# Patient Record
Sex: Male | Born: 1985 | Race: White | Hispanic: No | Marital: Married | Smoking: Never smoker
Health system: Southern US, Community
[De-identification: ages and names within clinical notes are randomized; demographics above are authoritative.]

## PROBLEM LIST (undated history)

## (undated) DIAGNOSIS — J45909 Unspecified asthma, uncomplicated: Secondary | ICD-10-CM

## (undated) DIAGNOSIS — T7840XA Allergy, unspecified, initial encounter: Secondary | ICD-10-CM

## (undated) DIAGNOSIS — Z800 Family history of malignant neoplasm of digestive organs: Secondary | ICD-10-CM

## (undated) DIAGNOSIS — G4730 Sleep apnea, unspecified: Secondary | ICD-10-CM

## (undated) HISTORY — PX: WISDOM TOOTH EXTRACTION: SHX21

## (undated) HISTORY — DX: Allergy, unspecified, initial encounter: T78.40XA

## (undated) HISTORY — DX: Sleep apnea, unspecified: G47.30

## (undated) HISTORY — DX: Family history of malignant neoplasm of digestive organs: Z80.0

## (undated) HISTORY — PX: OTHER SURGICAL HISTORY: SHX169

## (undated) HISTORY — DX: Unspecified asthma, uncomplicated: J45.909

## (undated) HISTORY — PX: FRACTURE SURGERY: SHX138

---

## 2016-02-08 ENCOUNTER — Ambulatory Visit (INDEPENDENT_AMBULATORY_CARE_PROVIDER_SITE_OTHER): Payer: 59 | Admitting: Allergy and Immunology

## 2016-02-08 ENCOUNTER — Encounter: Payer: Self-pay | Admitting: Allergy and Immunology

## 2016-02-08 VITALS — BP 128/74 | HR 78 | Temp 97.8°F | Resp 18 | Ht 76.0 in | Wt 295.4 lb

## 2016-02-08 DIAGNOSIS — T783XXA Angioneurotic edema, initial encounter: Secondary | ICD-10-CM

## 2016-02-08 DIAGNOSIS — Z8709 Personal history of other diseases of the respiratory system: Secondary | ICD-10-CM | POA: Diagnosis not present

## 2016-02-08 DIAGNOSIS — T7840XA Allergy, unspecified, initial encounter: Secondary | ICD-10-CM

## 2016-02-08 MED ORDER — EPINEPHRINE 0.3 MG/0.3ML IJ SOAJ: 0.3000 mg | Freq: Once | INTRAMUSCULAR | Status: AC

## 2016-02-08 NOTE — Patient Instructions (Addendum)
Allergic reaction The patient's history suggests alcohol intolerance.  Typically, this is related to rosacea, sulfite sensitivity, or genetic variations of enzyme alcohol dehydrogenase, though the latter is usually found in the Asian population. Given the associated angioedema of the lips, tongue, and eyelids, an epinephrine auto-injector will be provided.   Avoidance of alcohol containing products is recommended.  Should significant symptoms or new symptoms occur, a journal is to be kept recording any foods eaten, beverages consumed, medications taken, activities performed, and environmental conditions within a 6 hour time period prior to the onset of symptoms. For any symptoms concerning for anaphylaxis, epinephrine is to be administered and 911 is to be called immediately.   A prescription has been provided for EpiPen 0.3 mg 2 pack along with instructions for its proper administration.  History of asthma Earl Ortiz has not had significant lower respiratory symptoms or bronchodilator rescue medication requirement over the past 5 years despite upper respiratory tract infections and vigorous exercise.  Spirometry today reveals normal ventilatory function. As such, no treatment or further evaluation is necessary unless lower respiratory symptoms arise.    Return if symptoms worsen or fail to improve.

## 2016-02-08 NOTE — Progress Notes (Signed)
New Patient Note  RE: Earl Ortiz MRN: [id number] DOB: Dec 17, 1985 Date of Office Visit: 02/08/2016  Referring provider: No ref. provider found Primary care provider: Helane Rima, MD  Chief Complaint: Allergic Reaction   History of present illness: HPI Comments: Earl Ortiz is a 30 y.o. male for evaluation of possible food allergy.  He reports that over the past several years he has developed symptoms within minutes of consuming alcohol.  He experiences flushing of the face, neck, and arms, as well as occasional tingling at the tip of his tongue, mild swelling of the eyelids, and mild swelling of the lips.  Over the past several years the symptoms have progressed in severity.  He denies cardiopulmonary or other GI symptoms.  He has experienced one or more of these symptoms with various types of alcohol.  He states that he rarely consumes alcohol and therefore it is clear that these symptoms only occur when he is consuming alcohol.  The only time that he has experienced similar symptoms in the absence of an alcoholic beverage was when he took NyQuil, containing a small amount of alcohol, for upper respiratory tract infection symptom relief.  He has no Asian limits that he is aware of.  He reports that over this past year he has been diagnosed with gout and also was found to have abnormal liver function tests despite rarely consuming alcohol.   Assessment and plan: Allergic reaction The patient's history suggests alcohol intolerance.  Typically, this is related to rosacea, sulfite sensitivity, or genetic variations of enzyme alcohol dehydrogenase, though the latter is usually found in the Asian population. Given the associated angioedema of the lips, tongue, and eyelids, an epinephrine auto-injector will be provided.   Avoidance of alcohol containing products is recommended.  Should significant symptoms or new symptoms occur, a journal is to be kept recording any foods eaten, beverages  consumed, medications taken, activities performed, and environmental conditions within a 6 hour time period prior to the onset of symptoms. For any symptoms concerning for anaphylaxis, epinephrine is to be administered and 911 is to be called immediately.   A prescription has been provided for EpiPen 0.3 mg 2 pack along with instructions for its proper administration.  History of asthma Earl Ortiz has not had significant lower respiratory symptoms or bronchodilator rescue medication requirement over the past 5 years despite upper respiratory tract infections and vigorous exercise.  Spirometry today reveals normal ventilatory function. As such, no treatment or further evaluation is necessary unless lower respiratory symptoms arise.    Meds ordered this encounter  Medications  . EPINEPHrine 0.3 mg/0.3 mL IJ SOAJ injection    Sig: Inject 0.3 mLs (0.3 mg total) into the muscle once.    Dispense:  2 Device    Refill:  1    Epinephrine generic mylan only    Diagnositics: Spirometry:  Normal with an FEV1 of 87% predicted.  Please see scanned spirometry results for details.    Physical examination: Blood pressure 128/74, pulse 78, temperature 97.8 F (36.6 C), resp. rate 18, height 6\' 4"  (1.93 m), weight 295 lb 6.7 oz (134 kg), SpO2 98 %.  General: Alert, interactive, in no acute distress. Lungs: Clear to auscultation without wheezing, rhonchi or rales. CV: Normal S1, S2 without murmurs. Abdomen: Nondistended, nontender. Skin: Warm and dry, without lesions or rashes. Extremities:  No clubbing, cyanosis or edema. Neuro:   Grossly intact.  Review of systems:  Review of Systems  Constitutional: Negative for fever, chills and weight  loss.  HENT: Negative for congestion and nosebleeds.   Eyes: Negative for blurred vision.  Respiratory: Negative for hemoptysis, shortness of breath and wheezing.   Cardiovascular: Negative for chest pain.  Gastrointestinal: Negative for diarrhea and  constipation.  Genitourinary: Negative for dysuria.  Musculoskeletal: Negative for myalgias and joint pain.  Skin: Positive for rash.  Neurological: Positive for tingling. Negative for dizziness.  Endo/Heme/Allergies: Does not bruise/bleed easily.    Past medical history:  Past Medical History  Diagnosis Date  . Asthma     Past surgical history:  Past Surgical History  Procedure Laterality Date  . Left shoulder surgery    . Wisdom tooth extraction      Family history: Family History  Problem Relation Age of Onset  . Allergic rhinitis Brother   . Asthma Neg Hx   . Eczema Neg Hx   . Urticaria Neg Hx     Social history: Social History   Social History  . Marital Status: Married    Spouse Name: N/A  . Number of Children: N/A  . Years of Education: N/A   Occupational History  . Not on file.   Social History Main Topics  . Smoking status: Never Smoker   . Smokeless tobacco: Not on file  . Alcohol Use: Not on file  . Drug Use: Not on file  . Sexual Activity: Not on file   Other Topics Concern  . Not on file   Social History Narrative  . No narrative on file   Environmental History: The patient lives in a 32 year old apartment with carpeting throughout and central air/heat.  There is a dog and a cat in the apartment and the cat has access to his bedroom.  He is a nonsmoker and is not exposed to significant secondhand cigarette.    Medication List       This list is accurate as of: 02/08/16  1:12 PM.  Always use your most recent med list.               BENADRYL ALLERGY PO  Take by mouth.     EPINEPHrine 0.3 mg/0.3 mL Soaj injection  Commonly known as:  EPI-PEN  Inject 0.3 mLs (0.3 mg total) into the muscle once.     fexofenadine-pseudoephedrine 180-240 MG 24 hr tablet  Commonly known as:  ALLEGRA-D 24  Take 1 tablet by mouth daily.     metroNIDAZOLE 500 MG tablet  Commonly known as:  FLAGYL  Take 500 mg by mouth 3 (three) times daily.     ULORIC  40 MG tablet  Generic drug:  febuxostat  Take 40 mg by mouth daily.        Known medication allergies: No Known Allergies  I appreciate the opportunity to take part in this Earl Ortiz's care. Please do not hesitate to contact me with questions.  Sincerely,   R. Edgar Frisk, MD

## 2016-02-08 NOTE — Assessment & Plan Note (Signed)
The patient's history suggests alcohol intolerance.  Typically, this is related to rosacea, sulfite sensitivity, or genetic variations of enzyme alcohol dehydrogenase, though the latter is usually found in the Asian population. Given the associated angioedema of the lips, tongue, and eyelids, an epinephrine auto-injector will be provided.   Avoidance of alcohol containing products is recommended.  Should significant symptoms or new symptoms occur, a journal is to be kept recording any foods eaten, beverages consumed, medications taken, activities performed, and environmental conditions within a 6 hour time period prior to the onset of symptoms. For any symptoms concerning for anaphylaxis, epinephrine is to be administered and 911 is to be called immediately.   A prescription has been provided for EpiPen 0.3 mg 2 pack along with instructions for its proper administration.

## 2016-02-08 NOTE — Assessment & Plan Note (Addendum)
Earl Ortiz has not had significant lower respiratory symptoms or bronchodilator rescue medication requirement over the past 5 years despite upper respiratory tract infections and vigorous exercise.  Spirometry today reveals normal ventilatory function. As such, no treatment or further evaluation is necessary unless lower respiratory symptoms arise.

## 2016-02-11 ENCOUNTER — Encounter: Payer: Self-pay | Admitting: *Deleted

## 2016-08-08 DIAGNOSIS — D239 Other benign neoplasm of skin, unspecified: Secondary | ICD-10-CM | POA: Diagnosis not present

## 2016-08-08 DIAGNOSIS — L709 Acne, unspecified: Secondary | ICD-10-CM | POA: Diagnosis not present

## 2016-09-19 DIAGNOSIS — L089 Local infection of the skin and subcutaneous tissue, unspecified: Secondary | ICD-10-CM | POA: Diagnosis not present

## 2016-09-19 DIAGNOSIS — L739 Follicular disorder, unspecified: Secondary | ICD-10-CM | POA: Diagnosis not present

## 2016-11-07 DIAGNOSIS — Z5181 Encounter for therapeutic drug level monitoring: Secondary | ICD-10-CM | POA: Diagnosis not present

## 2016-11-07 DIAGNOSIS — Z79899 Other long term (current) drug therapy: Secondary | ICD-10-CM | POA: Diagnosis not present

## 2016-11-07 DIAGNOSIS — L709 Acne, unspecified: Secondary | ICD-10-CM | POA: Diagnosis not present

## 2016-12-08 DIAGNOSIS — L739 Follicular disorder, unspecified: Secondary | ICD-10-CM | POA: Diagnosis not present

## 2016-12-08 DIAGNOSIS — Z5181 Encounter for therapeutic drug level monitoring: Secondary | ICD-10-CM | POA: Diagnosis not present

## 2016-12-08 DIAGNOSIS — Z79899 Other long term (current) drug therapy: Secondary | ICD-10-CM | POA: Diagnosis not present

## 2016-12-08 DIAGNOSIS — L709 Acne, unspecified: Secondary | ICD-10-CM | POA: Diagnosis not present

## 2017-01-09 DIAGNOSIS — L709 Acne, unspecified: Secondary | ICD-10-CM | POA: Diagnosis not present

## 2017-01-09 DIAGNOSIS — Z79899 Other long term (current) drug therapy: Secondary | ICD-10-CM | POA: Diagnosis not present

## 2017-01-09 DIAGNOSIS — Z5181 Encounter for therapeutic drug level monitoring: Secondary | ICD-10-CM | POA: Diagnosis not present

## 2017-04-10 DIAGNOSIS — J0140 Acute pansinusitis, unspecified: Secondary | ICD-10-CM | POA: Diagnosis not present

## 2017-04-14 ENCOUNTER — Emergency Department (HOSPITAL_BASED_OUTPATIENT_CLINIC_OR_DEPARTMENT_OTHER): Payer: Worker's Compensation

## 2017-04-14 ENCOUNTER — Emergency Department (HOSPITAL_BASED_OUTPATIENT_CLINIC_OR_DEPARTMENT_OTHER)
Admission: EM | Admit: 2017-04-14 | Discharge: 2017-04-14 | Disposition: A | Discharge status: Home / Self Care | Payer: Worker's Compensation | Attending: Emergency Medicine | Admitting: Emergency Medicine

## 2017-04-14 ENCOUNTER — Encounter (HOSPITAL_BASED_OUTPATIENT_CLINIC_OR_DEPARTMENT_OTHER): Payer: Self-pay | Admitting: *Deleted

## 2017-04-14 DIAGNOSIS — Y929 Unspecified place or not applicable: Secondary | ICD-10-CM | POA: Insufficient documentation

## 2017-04-14 DIAGNOSIS — Z79899 Other long term (current) drug therapy: Secondary | ICD-10-CM | POA: Insufficient documentation

## 2017-04-14 DIAGNOSIS — Y999 Unspecified external cause status: Secondary | ICD-10-CM | POA: Diagnosis not present

## 2017-04-14 DIAGNOSIS — M25531 Pain in right wrist: Secondary | ICD-10-CM | POA: Insufficient documentation

## 2017-04-14 DIAGNOSIS — Y9389 Activity, other specified: Secondary | ICD-10-CM | POA: Diagnosis not present

## 2017-04-14 DIAGNOSIS — X501XXA Overexertion from prolonged static or awkward postures, initial encounter: Secondary | ICD-10-CM | POA: Diagnosis not present

## 2017-04-14 DIAGNOSIS — J45909 Unspecified asthma, uncomplicated: Secondary | ICD-10-CM | POA: Diagnosis not present

## 2017-04-14 DIAGNOSIS — S6991XA Unspecified injury of right wrist, hand and finger(s), initial encounter: Secondary | ICD-10-CM | POA: Diagnosis present

## 2017-04-14 MED ORDER — IBUPROFEN 600 MG PO TABS: 600.0000 mg | ORAL_TABLET | Freq: Four times a day (QID) | ORAL | 0 refills | Status: DC | PRN

## 2017-04-14 NOTE — ED Triage Notes (Signed)
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 Swelling.

## 2017-04-14 NOTE — Discharge Instructions (Signed)
You have been seen today for a wrist injury. There were no acute abnormalities on the x-rays, including no sign of fracture or dislocation. Pain: Take 600 mg of ibuprofen every six hours for the next three days. Take this medication with food to avoid upset stomach. Choose one of these medications, but not both.  Ice: May apply ice to the area over the next 24 hours for 15 minutes at a time to reduce swelling. Elevation: Keep the extremity elevated as often as possible to reduce pain and inflammation. Splint: Wear the wrist splint for support and comfort. Wear this until pain resolves. Exercises: Start by performing these exercises a few times a week, increasing the frequency until you are performing them twice daily.  Follow up: If symptoms are not improving by Monday, follow up with the hand specialist. Call the number provided to set up an appointment.

## 2017-04-14 NOTE — ED Provider Notes (Signed)
[facility] Provider Note   CSN: [id number] Arrival date & time: 04/14/17  2123 By signing my name below, I, Earl Ortiz, attest that this documentation has been prepared under the direction and in the presence of Earl Lavalais, PA-C. Electronically Signed: Georgette Ortiz, ED Scribe. 04/14/17. 10:10 PM.  History   Chief Complaint Chief Complaint  Patient presents with  . Wrist Injury    HPI The history is provided by the patient. No language interpreter was used.   HPI Comments: Earl Ortiz is a 31 y.o. male with h/o asthma, who presents to the Emergency Department complaining of right wrist pain with swelling following mechanical injury occurring several hours ago. Pain is burning in quality and mild to moderate. Pt notes he also has some mild paresthesias to his right fingers. 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 He states that pain is exacerbated with palpation. He has not tried any OTC medications PTA. Denies any h/o injury or surgery to the area. Denies weakness, numbness, other injuries, or any other complaints.     Past Medical History:  Diagnosis Date  . Asthma     Patient Active Problem List   Diagnosis Date Noted  . Allergic reaction 02/08/2016  . History of asthma 02/08/2016  . Angioedema 02/08/2016    Past Surgical History:  Procedure Laterality Date  . left shoulder surgery    . WISDOM TOOTH EXTRACTION         Home Medications    Prior to Admission medications   Medication Sig Start Date End Date Taking? Authorizing Provider  Amoxicillin-Pot Clavulanate (AUGMENTIN PO) Take by mouth.   Yes [provider]  oxymetazoline (AFRIN) 0.05 % nasal spray Place 1 spray into both nostrils 2 (two) times daily.   Yes [provider]  DiphenhydrAMINE HCl (BENADRYL ALLERGY PO) Take by mouth.    [provider]  EPINEPHrine 0.3 mg/0.3 mL IJ SOAJ injection  Inject 0.3 mLs (0.3 mg total) into the muscle once. 02/08/16   [name], [name], MD  febuxostat (ULORIC) 40 MG tablet Take 40 mg by mouth daily.    [provider]  fexofenadine-pseudoephedrine (ALLEGRA-D 24) 180-240 MG 24 hr tablet Take 1 tablet by mouth daily.    [provider]  ibuprofen (ADVIL,MOTRIN) 600 MG tablet Take 1 tablet (600 mg total) by mouth every 6 (six) hours as needed. 04/14/17   [name], PA-C  metroNIDAZOLE (FLAGYL) 500 MG tablet Take 500 mg by mouth 3 (three) times daily.    [provider]    Family History Family History  Problem Relation Age of Onset  . Allergic rhinitis Brother   . Asthma Neg Hx   . Eczema Neg Hx   . Urticaria Neg Hx     Social History Social History  Substance Use Topics  . Smoking status: Never Smoker  . Smokeless tobacco: Never Used  . Alcohol use No     Allergies   Patient has no known allergies.   Review of Systems Review of Systems  Musculoskeletal: Positive for arthralgias and joint swelling.  Neurological: Negative for weakness and numbness.   Physical Exam Updated Vital Signs BP (!) 140/96 (BP Location: Left Arm)   Pulse 90   Temp 98.4 F (36.9 C) (Oral)   Resp 16   Ht 6\' 4"  (1.93 m)   Wt 285 lb (129.3 kg)   SpO2 98%  BMI 34.69 kg/m   Physical Exam  Constitutional: He appears well-developed and well-nourished. No distress.  HENT:  Head: Normocephalic and atraumatic.  Eyes: Conjunctivae are normal.  Neck: Neck supple.  Cardiovascular: Normal rate, regular rhythm and intact distal pulses.   Pulmonary/Chest: Effort normal. No respiratory distress.  Musculoskeletal: Normal range of motion. He exhibits edema and tenderness.  Swelling and tenderness over the right ulnar wrist. Full ROM in the wrist and hand.  Neurological: He is alert.  Mild paresthesias noted to the right fourth and fifth digits in the ulnar nerve distribution. Questionable weakness with abduction and adduction  of the fourth and fifth digits on the right. Flexion and extension of the fingers of the right hand fully intact against resistance, tested individually at the DIP, PIP, and MCP joints.  Skin: Skin is warm and dry. Capillary refill takes less than 2 seconds. He is not diaphoretic.  Psychiatric: He has a normal mood and affect. His behavior is normal.  Nursing note and vitals reviewed.  ED Treatments / Results  DIAGNOSTIC STUDIES: Oxygen Saturation is 98% on RA, normal by my interpretation.   COORDINATION OF CARE: 10:09 PM-Discussed next steps with pt. Pt verbalized understanding and is agreeable with the plan.   Labs (all labs ordered are listed, but only abnormal results are displayed) Labs Reviewed - No data to display  EKG  EKG Interpretation None       Radiology Dg Wrist Complete Right  Result Date: 04/14/2017 CLINICAL DATA:  Pain after trauma. EXAM: RIGHT WRIST - COMPLETE 3+ VIEW COMPARISON:  None. FINDINGS: There is no evidence of fracture or dislocation. There is no evidence of arthropathy or other focal bone abnormality. Soft tissues are unremarkable. IMPRESSION: Negative. Electronically Signed   By: Dorise Bullion III M.D   On: 04/14/2017 21:58    Procedures Procedures (including critical care time)  Medications Ordered in ED Medications - No data to display   Initial Impression / Assessment and Plan / ED Course  I have reviewed the triage vital signs and the nursing notes.  Pertinent labs & imaging results that were available during my care of the patient were reviewed by me and considered in my medical decision making (see chart for details).      Patient presents with pain to the right wrist. Patient's symptoms could be due to a mild neuropraxia. Hand surgery follow up. The patient was given instructions for home care as well as strict return precautions. Patient voices understanding of these instructions, accepts the plan, and is comfortable with  discharge.   Findings and plan of care discussed with Davonna Belling, MD.   Final Clinical Impressions(s) / ED Diagnoses   Final diagnoses:  Right wrist pain    New Prescriptions Discharge Medication List as of 04/14/2017 10:22 PM    START taking these medications   Details  ibuprofen (ADVIL,MOTRIN) 600 MG tablet Take 1 tablet (600 mg total) by mouth every 6 (six) hours as needed., Starting Fri 04/14/2017, Print       I personally performed the services described in this documentation, which was scribed in my presence. The recorded information has been reviewed and is accurate.    Lorayne Bender, PA-C 04/16/17 1304    Davonna Belling, MD 04/16/17 1521

## 2017-04-20 DIAGNOSIS — M79641 Pain in right hand: Secondary | ICD-10-CM | POA: Insufficient documentation

## 2017-04-20 DIAGNOSIS — M25531 Pain in right wrist: Secondary | ICD-10-CM | POA: Insufficient documentation

## 2017-06-12 DIAGNOSIS — S63591A Other specified sprain of right wrist, initial encounter: Secondary | ICD-10-CM | POA: Diagnosis not present

## 2017-06-12 DIAGNOSIS — D1721 Benign lipomatous neoplasm of skin and subcutaneous tissue of right arm: Secondary | ICD-10-CM | POA: Diagnosis not present

## 2017-06-12 DIAGNOSIS — M24131 Other articular cartilage disorders, right wrist: Secondary | ICD-10-CM | POA: Diagnosis not present

## 2017-06-16 DIAGNOSIS — E6609 Other obesity due to excess calories: Secondary | ICD-10-CM | POA: Diagnosis not present

## 2017-06-16 DIAGNOSIS — Z6835 Body mass index (BMI) 35.0-35.9, adult: Secondary | ICD-10-CM | POA: Diagnosis not present

## 2017-06-16 DIAGNOSIS — Z0000 Encounter for general adult medical examination without abnormal findings: Secondary | ICD-10-CM | POA: Diagnosis not present

## 2017-06-19 DIAGNOSIS — E7800 Pure hypercholesterolemia, unspecified: Secondary | ICD-10-CM | POA: Insufficient documentation

## 2017-07-26 DIAGNOSIS — M25639 Stiffness of unspecified wrist, not elsewhere classified: Secondary | ICD-10-CM | POA: Diagnosis not present

## 2017-07-26 DIAGNOSIS — M25531 Pain in right wrist: Secondary | ICD-10-CM | POA: Diagnosis not present

## 2017-07-26 DIAGNOSIS — M79641 Pain in right hand: Secondary | ICD-10-CM | POA: Diagnosis not present

## 2017-07-30 DIAGNOSIS — M25639 Stiffness of unspecified wrist, not elsewhere classified: Secondary | ICD-10-CM | POA: Insufficient documentation

## 2017-08-03 DIAGNOSIS — R531 Weakness: Secondary | ICD-10-CM | POA: Diagnosis not present

## 2017-08-03 DIAGNOSIS — M25639 Stiffness of unspecified wrist, not elsewhere classified: Secondary | ICD-10-CM | POA: Diagnosis not present

## 2017-08-09 DIAGNOSIS — M25639 Stiffness of unspecified wrist, not elsewhere classified: Secondary | ICD-10-CM | POA: Diagnosis not present

## 2017-08-09 DIAGNOSIS — R531 Weakness: Secondary | ICD-10-CM | POA: Diagnosis not present

## 2017-08-31 DIAGNOSIS — M7751 Other enthesopathy of right foot: Secondary | ICD-10-CM | POA: Diagnosis not present

## 2017-08-31 DIAGNOSIS — M65871 Other synovitis and tenosynovitis, right ankle and foot: Secondary | ICD-10-CM | POA: Diagnosis not present

## 2017-09-05 DIAGNOSIS — M25531 Pain in right wrist: Secondary | ICD-10-CM | POA: Diagnosis not present

## 2017-09-05 DIAGNOSIS — M25639 Stiffness of unspecified wrist, not elsewhere classified: Secondary | ICD-10-CM | POA: Diagnosis not present

## 2017-09-05 DIAGNOSIS — R531 Weakness: Secondary | ICD-10-CM | POA: Diagnosis not present

## 2017-09-12 DIAGNOSIS — R531 Weakness: Secondary | ICD-10-CM | POA: Diagnosis not present

## 2017-09-12 DIAGNOSIS — S63591A Other specified sprain of right wrist, initial encounter: Secondary | ICD-10-CM | POA: Diagnosis not present

## 2017-09-12 DIAGNOSIS — M25631 Stiffness of right wrist, not elsewhere classified: Secondary | ICD-10-CM | POA: Diagnosis not present

## 2017-09-14 DIAGNOSIS — M25531 Pain in right wrist: Secondary | ICD-10-CM | POA: Diagnosis not present

## 2017-09-14 DIAGNOSIS — M79641 Pain in right hand: Secondary | ICD-10-CM | POA: Diagnosis not present

## 2017-09-20 DIAGNOSIS — R531 Weakness: Secondary | ICD-10-CM | POA: Diagnosis not present

## 2017-09-20 DIAGNOSIS — M25531 Pain in right wrist: Secondary | ICD-10-CM | POA: Diagnosis not present

## 2017-09-27 DIAGNOSIS — M25631 Stiffness of right wrist, not elsewhere classified: Secondary | ICD-10-CM | POA: Diagnosis not present

## 2017-09-27 DIAGNOSIS — R531 Weakness: Secondary | ICD-10-CM | POA: Diagnosis not present

## 2017-10-04 DIAGNOSIS — R531 Weakness: Secondary | ICD-10-CM | POA: Diagnosis not present

## 2017-10-04 DIAGNOSIS — M25631 Stiffness of right wrist, not elsewhere classified: Secondary | ICD-10-CM | POA: Diagnosis not present

## 2017-10-12 DIAGNOSIS — M79641 Pain in right hand: Secondary | ICD-10-CM | POA: Diagnosis not present

## 2017-10-12 DIAGNOSIS — R531 Weakness: Secondary | ICD-10-CM | POA: Diagnosis not present

## 2017-10-12 DIAGNOSIS — M25531 Pain in right wrist: Secondary | ICD-10-CM | POA: Diagnosis not present

## 2018-01-05 DIAGNOSIS — M71571 Other bursitis, not elsewhere classified, right ankle and foot: Secondary | ICD-10-CM | POA: Diagnosis not present

## 2018-01-05 DIAGNOSIS — M65871 Other synovitis and tenosynovitis, right ankle and foot: Secondary | ICD-10-CM | POA: Diagnosis not present

## 2018-01-11 DIAGNOSIS — M10071 Idiopathic gout, right ankle and foot: Secondary | ICD-10-CM | POA: Diagnosis not present

## 2018-01-11 DIAGNOSIS — M65871 Other synovitis and tenosynovitis, right ankle and foot: Secondary | ICD-10-CM | POA: Diagnosis not present

## 2018-01-11 DIAGNOSIS — M71571 Other bursitis, not elsewhere classified, right ankle and foot: Secondary | ICD-10-CM | POA: Diagnosis not present

## 2018-10-23 IMAGING — CR DG WRIST COMPLETE 3+V*R*
4 series · 4 of 4 positions shown · non-contrast
Comparison: None.

CLINICAL DATA: Pain after trauma.

EXAM:
RIGHT WRIST - COMPLETE 3+ VIEW

[x wrist pa right]
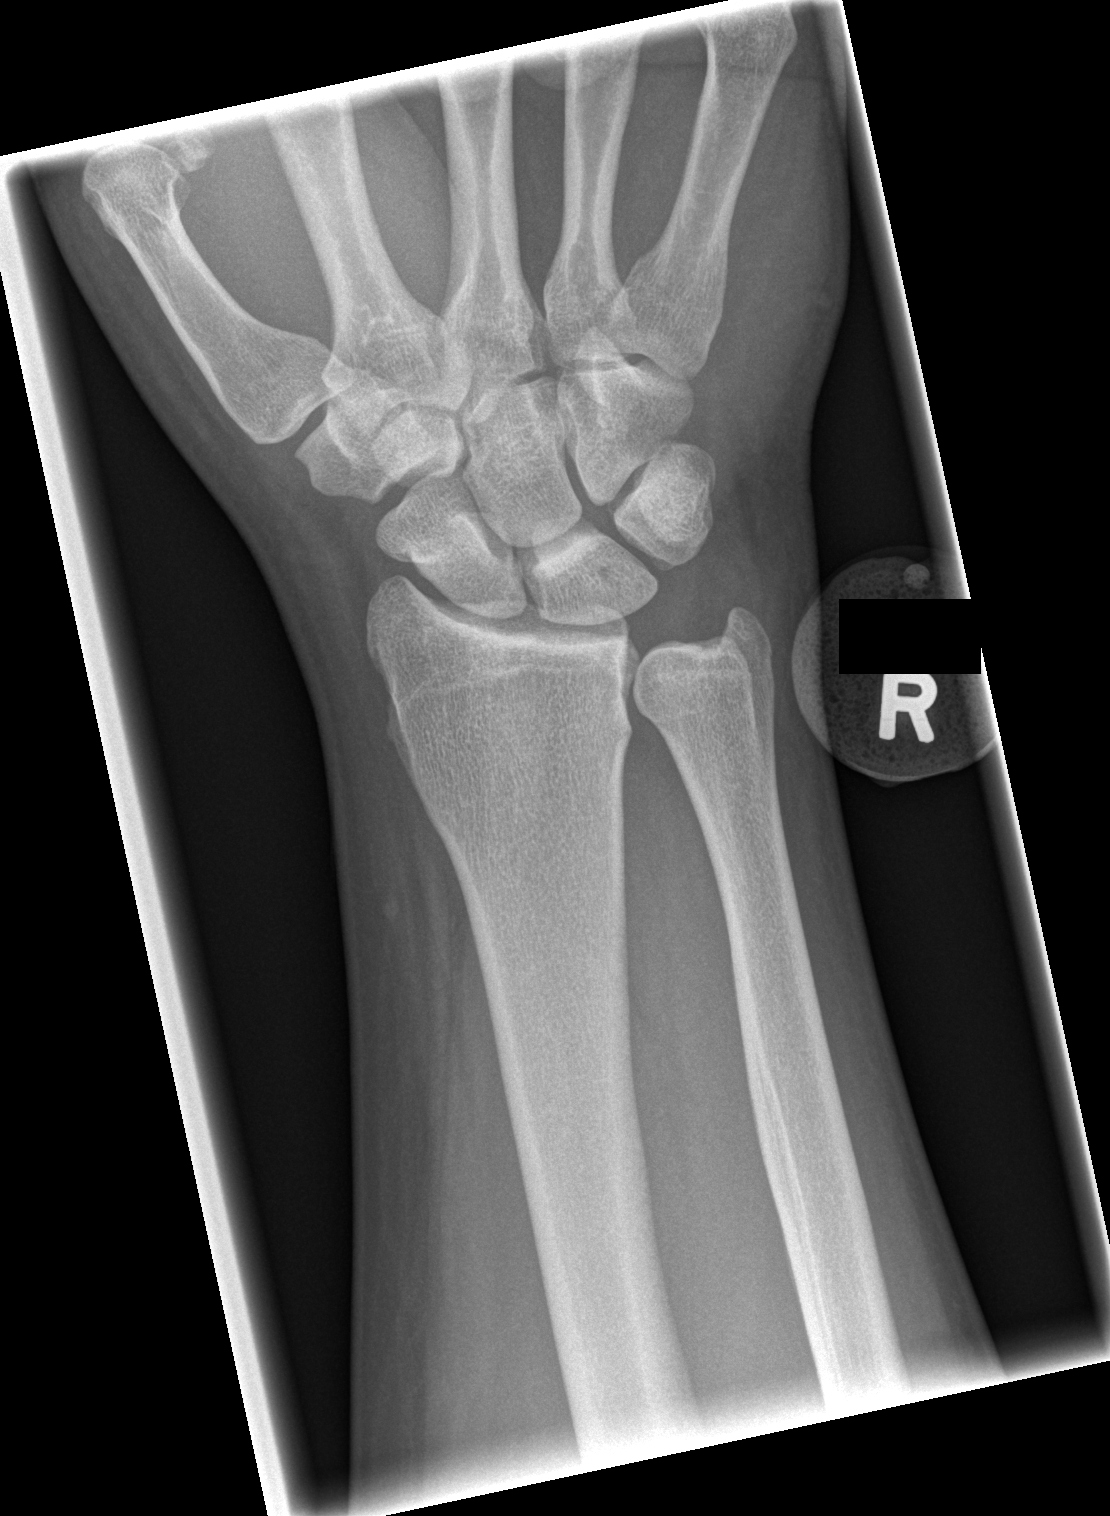

[x wrist obl right]
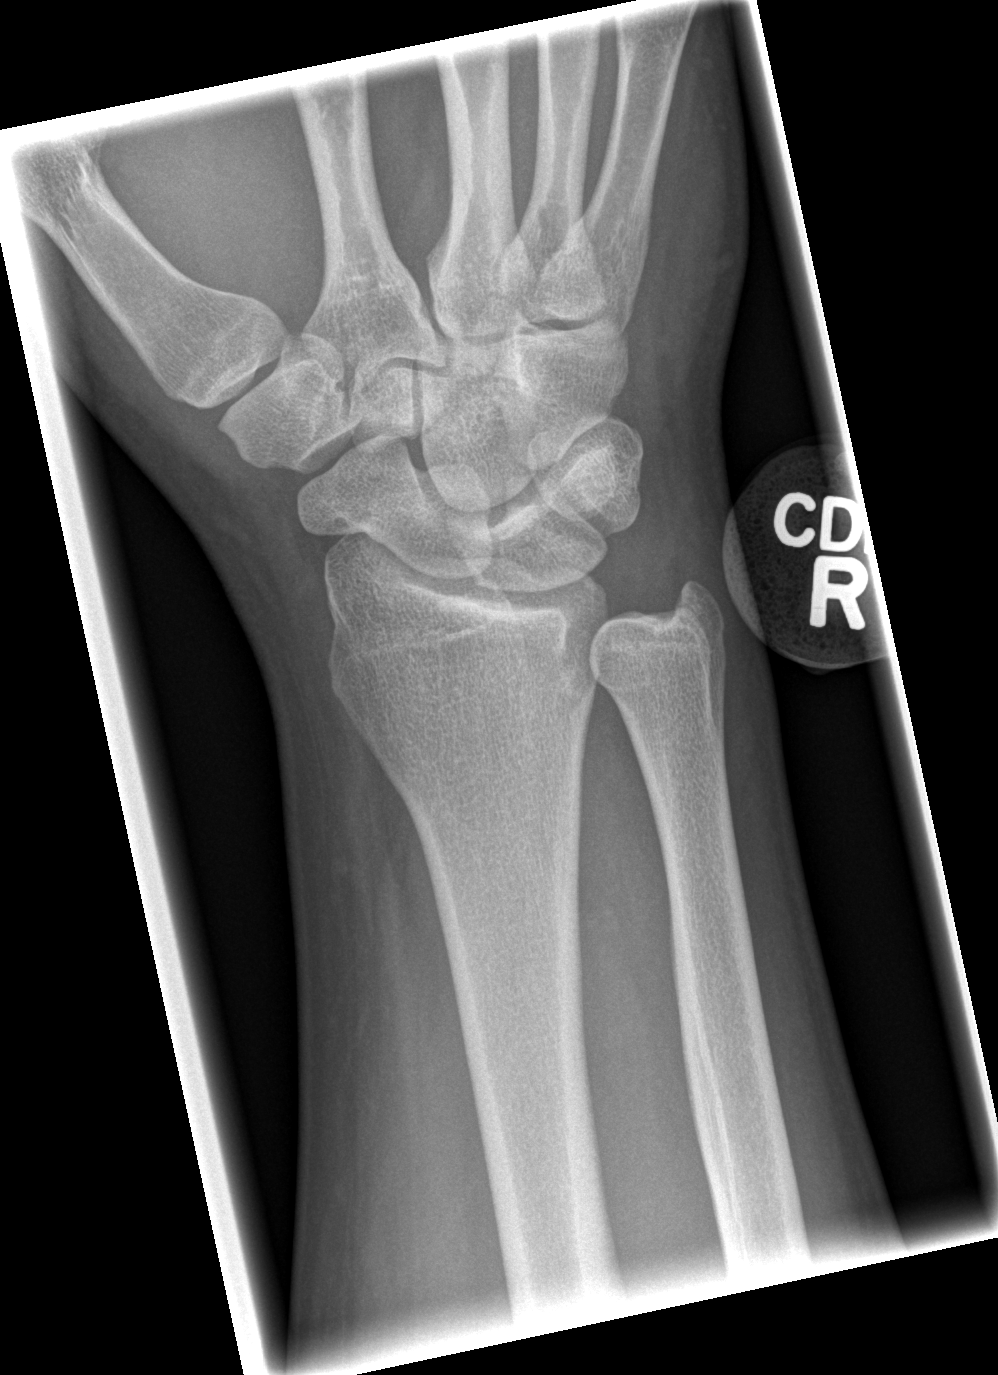

[x wrist lat right]
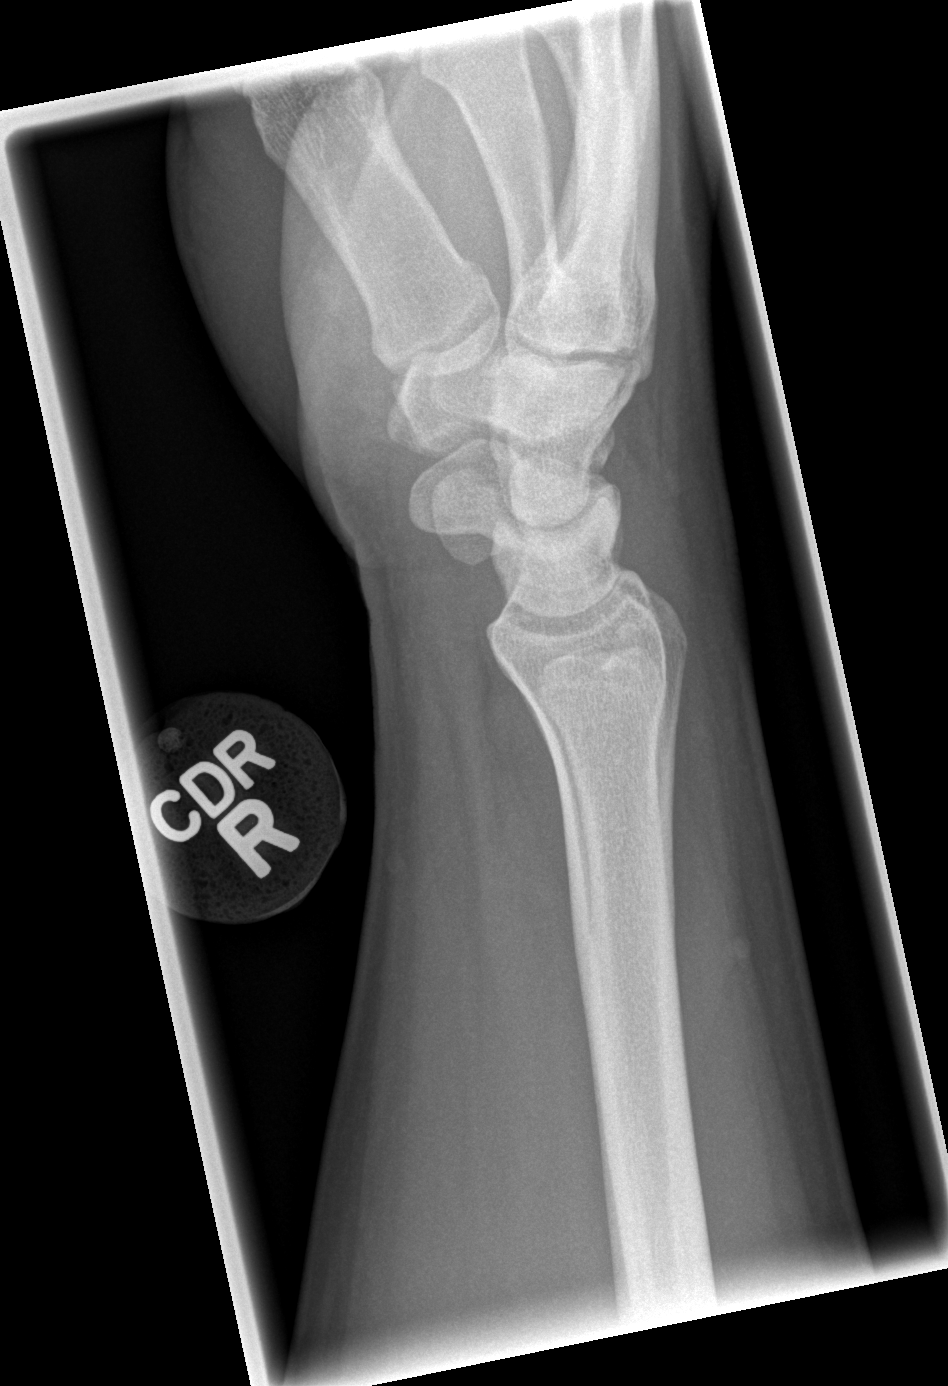

[x navicular]
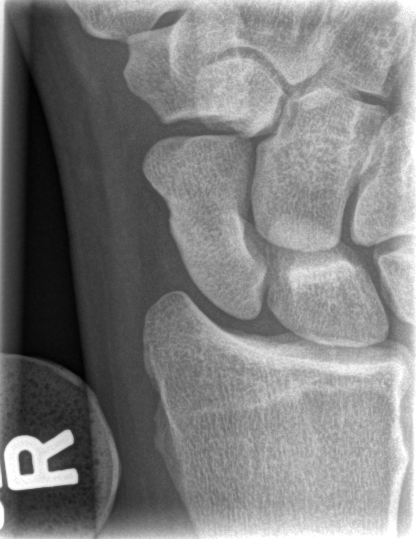

[4 of 4 positions shown; findings below may reference images not displayed]

FINDINGS: There is no evidence of fracture or dislocation. There is no
evidence of arthropathy or other focal bone abnormality. Soft
tissues are unremarkable.
IMPRESSION: Negative.

## 2020-07-16 ENCOUNTER — Telehealth: Payer: Self-pay | Admitting: Genetic Counselor

## 2020-07-16 NOTE — Telephone Encounter (Signed)
Received a genetic counseling referral from Dr. Lindell Noe at [facility] for fhx of pancreatic cancer. Mr. Burbano has been cld and scheduled to see Raquel Sarna on 8/17 at 9am. Pt aware to arrive 15 minutes early.

## 2020-07-28 ENCOUNTER — Encounter: Payer: Self-pay | Admitting: Genetic Counselor

## 2020-07-28 ENCOUNTER — Other Ambulatory Visit: Payer: Self-pay

## 2020-08-05 ENCOUNTER — Other Ambulatory Visit: Payer: Self-pay | Admitting: Genetic Counselor

## 2020-08-05 ENCOUNTER — Inpatient Hospital Stay: Payer: Managed Care, Other (non HMO) | Attending: Genetic Counselor | Admitting: Genetic Counselor

## 2020-08-05 ENCOUNTER — Other Ambulatory Visit: Payer: Self-pay

## 2020-08-05 ENCOUNTER — Inpatient Hospital Stay: Payer: Managed Care, Other (non HMO)

## 2020-08-05 ENCOUNTER — Encounter: Payer: Self-pay | Admitting: Genetic Counselor

## 2020-08-05 DIAGNOSIS — Z800 Family history of malignant neoplasm of digestive organs: Secondary | ICD-10-CM

## 2020-08-05 LAB — GENETIC SCREENING ORDER

## 2020-08-05 NOTE — Progress Notes (Signed)
REFERRING PROVIDER: Glenis Smoker, MD [address] [place],  [place] [address]  PRIMARY PROVIDER:  Helane Rima, MD  PRIMARY REASON FOR VISIT:  1. Family history of pancreatic cancer      HISTORY OF PRESENT ILLNESS:   Mr. Millhouse, a 34 y.o. male, was seen for a [place] cancer genetics consultation at the request of Dr. Lindell Noe due to a family history of pancreatic cancer.  Mr. Umar presents to clinic today to discuss the possibility of a hereditary predisposition to cancer, genetic testing, and to further clarify his future cancer risks, as well as potential cancer risks for family members.   Mr. Mazor is a 34 y.o. male with no personal history of cancer.  He is relatively healthy, with previous diagnosis of gout and childhood asthma.  CANCER HISTORY:  Oncology History   No history exists.     Past Medical History:  Diagnosis Date  . Asthma   . Family history of pancreatic cancer     Past Surgical History:  Procedure Laterality Date  . left shoulder surgery    . WISDOM TOOTH EXTRACTION      Social History   Socioeconomic History  . Marital status: Married    Spouse name: Not on file  . Number of children: Not on file  . Years of education: Not on file  . Highest education level: Not on file  Occupational History  . Not on file  Tobacco Use  . Smoking status: Never Smoker  . Smokeless tobacco: Never Used  Substance and Sexual Activity  . Alcohol use: No    Alcohol/week: 0.0 standard drinks  . Drug use: No  . Sexual activity: Not on file  Other Topics Concern  . Not on file  Social History Narrative  . Not on file   Social Determinants of Health   Financial Resource Strain:   . Difficulty of Paying Living Expenses: Not on file  Food Insecurity:   . Worried About Charity fundraiser in the Last Year: Not on file  . Ran Out of Food in the Last Year: Not on file  Transportation Needs:   . Lack of Transportation (Medical): Not on file   . Lack of Transportation (Non-Medical): Not on file  Physical Activity:   . Days of Exercise per Week: Not on file  . Minutes of Exercise per Session: Not on file  Stress:   . Feeling of Stress : Not on file  Social Connections:   . Frequency of Communication with Friends and Family: Not on file  . Frequency of Social Gatherings with Friends and Family: Not on file  . Attends Religious Services: Not on file  . Active Member of Clubs or Organizations: Not on file  . Attends Archivist Meetings: Not on file  . Marital Status: Not on file     FAMILY HISTORY:  We obtained a detailed, 4-generation family history.  Significant diagnoses are listed below: Family History  Problem Relation Age of Onset  . Allergic rhinitis Brother   . Pancreatic cancer Mother 33  . Pancreatic cancer Father 37  . Diabetes Maternal Uncle   . Stroke Maternal Grandmother   . Diabetes Maternal Grandfather   . Lung cancer Paternal Grandmother        smoker  . Healthy Son   . Healthy Daughter   . Asthma Neg Hx   . Eczema Neg Hx   . Urticaria Neg Hx     The patient has  two children, ages 7 and 2 1/2 who are healthy.  He has one brother who is reportedly cancer free.  His father is deceased and his mother is living.  The patient's mother was diagnosed with pancreatic cancer at age 81.  She is stage IV at diagnosis, has had a whipple surgery and is in a clinical trial for her cancer.  She is responding well.  She has one brother with diabetes, but no cancer.  Her parents are deceased from stroke and natural causes'.  The patient's father was diagnosed with pancreatic cancer at age 36.  He had 3 sisters who are reportedly cancer free.  His mother may have had lung cancer and the cause of death of his father is unknown to the patient.  Mr. Gittleman is unaware of previous family history of genetic testing for hereditary cancer risks. Patient's maternal ancestors are of Caucasian descent, and paternal  ancestors are of Caucasian descent. There is no reported Ashkenazi Jewish ancestry. There is no known consanguinity.  GENETIC COUNSELING ASSESSMENT: Mr. Lindaman is a 34 y.o. male with a family history of pancreatic cancer which is somewhat suggestive of a hereditary cancer syndrome and predisposition to cancer given the diagnosis of pancreatic cancer, specifically in his father who was 3. We, therefore, discussed and recommended the following at today's visit.   DISCUSSION: We discussed that up to 15% of pancreatic cancer is hereditary, with most cases associated with BRCA mutations.  There are other genes that can be associated with hereditary including pancreatic cancer syndromes.  These include PALB2 and the Lynch syndrome genes.  We discussed that testing is beneficial for several reasons including knowing how to follow individuals and understand if other family members could be at risk for cancer and allow them to undergo genetic testing.   We reviewed the characteristics, features and inheritance patterns of hereditary cancer syndromes. We also discussed genetic testing, including the appropriate family members to test, the process of testing, insurance coverage and turn-around-time for results. We discussed the implications of a negative, positive, carrier and/or variant of uncertain significant result. We recommended Mr. Gonzalez pursue genetic testing for the 56 gene panel including the common hereditary cancer gene panel, pancreatitis gene panel and preliminary evidence gene panel.  Based on Mr. Kolek's family history of cancer, he meets medical criteria for genetic testing. Despite that he meets criteria, he may still have an out of pocket cost. We discussed that if his out of pocket cost for testing is over $100, the laboratory will call and confirm whether he wants to proceed with testing.  If the out of pocket cost of testing is less than $100 he will be billed by the genetic testing laboratory.    PLAN: After considering the risks, benefits, and limitations, Mr. Mano provided informed consent to pursue genetic testing and the blood sample was sent to [facility] for analysis of the Common hereditary, pancreatitis and preliminary evidence gene panel. Results should be available within approximately 2-3 weeks' time, at which point they will be disclosed by telephone to Mr. Durango, as will any additional recommendations warranted by these results. Mr. Moorman will receive a summary of his genetic counseling visit and a copy of his results once available. This information will also be available in Epic.   Lastly, we encouraged Mr. Heuerman to remain in contact with cancer genetics annually so that we can continuously update the family history and inform him of any changes in cancer genetics and testing that may  be of benefit for this family.   Mr. Elman questions were answered to his satisfaction today. Our contact information was provided should additional questions or concerns arise. Thank you for the referral and allowing [place] to share in the care of your patient.   Raffi Milstein P. Florene Glen, [place], [facility] Licensed, Insurance risk surveyor [email or web address] phone: [phone]  The patient was seen for a total of 35 minutes in face-to-face genetic counseling.  This patient was discussed with Drs. Magrinat, Lindi Adie and/or Burr Medico who agrees with the above.    _______________________________________________________________________ For Office Staff:  Number of people involved in session: 1 Was an Intern/ student involved with case: no

## 2020-08-18 ENCOUNTER — Telehealth: Payer: Self-pay | Admitting: Genetic Counselor

## 2020-08-18 ENCOUNTER — Encounter: Payer: Self-pay | Admitting: Genetic Counselor

## 2020-08-18 DIAGNOSIS — Z1379 Encounter for other screening for genetic and chromosomal anomalies: Secondary | ICD-10-CM | POA: Insufficient documentation

## 2020-08-18 DIAGNOSIS — Z0000 Encounter for general adult medical examination without abnormal findings: Secondary | ICD-10-CM | POA: Insufficient documentation

## 2020-08-18 NOTE — Telephone Encounter (Signed)
LM on VM that results are back and to please call. 

## 2020-08-20 ENCOUNTER — Encounter: Payer: Self-pay | Admitting: Genetic Counselor

## 2020-08-20 ENCOUNTER — Ambulatory Visit: Payer: Self-pay | Admitting: Genetic Counselor

## 2020-08-20 ENCOUNTER — Telehealth: Payer: Self-pay | Admitting: Genetic Counselor

## 2020-08-20 DIAGNOSIS — Z1379 Encounter for other screening for genetic and chromosomal anomalies: Secondary | ICD-10-CM

## 2020-08-20 NOTE — Progress Notes (Addendum)
HPI:  Mr. Kassebaum was previously seen in the [place] clinic due to a family history of pancreatic cancer and concerns regarding a hereditary predisposition to cancer. Please refer to our prior cancer genetics clinic note for more information regarding our discussion, assessment and recommendations, at the time. Mr. Saefong recent genetic test results were disclosed to him, as were recommendations warranted by these results. These results and recommendations are discussed in more detail below.  CANCER HISTORY:  Oncology History   No history exists.    FAMILY HISTORY:  We obtained a detailed, 4-generation family history.  Significant diagnoses are listed below: Family History  Problem Relation Age of Onset   Allergic rhinitis Brother    Pancreatic cancer Mother 76       PNET   Pancreatic cancer Father 82       Adenocarcioma   Diabetes Maternal Uncle    Stroke Maternal Grandmother    Diabetes Maternal Grandfather    Lung cancer Paternal Grandmother        smoker   Healthy Son    Healthy Daughter    Asthma Neg Hx    Eczema Neg Hx    Urticaria Neg Hx     The patient has two children, ages 49 and 2 1/2 who are healthy.  He has one brother who is reportedly cancer free.  His father is deceased and his mother is living.   The patient's mother was diagnosed with pancreatic cancer at age 81.  She is stage IV at diagnosis, has had a whipple surgery and is in a clinical trial for her cancer.  She is responding well.  She has one brother with diabetes, but no cancer.  Her parents are deceased from stroke and natural causes'.   The patient's father was diagnosed with pancreatic cancer at age 60.  He had 3 sisters who are reportedly cancer free.  His mother may have had lung cancer and the cause of death of his father is unknown to the patient.   Mr. Hoglund is unaware of previous family history of genetic testing for hereditary cancer risks. Patient's maternal ancestors are of  Caucasian descent, and paternal ancestors are of Caucasian descent. There is no reported Ashkenazi Jewish ancestry. There is no known consanguinity.  GENETIC TEST RESULTS: Genetic testing reported out on August 20, 2020 through the 92-gene cancer panel found no pathogenic mutations. The Multi-Gene Panel offered by [facility] includes sequencing and/or deletion duplication testing of the following 92 genes: AIP, ALK, APC*, ATM*, AXIN2, BAP1, BARD1, BLM, BMPR1A, BRCA1, BRCA2, BRIP1, CASR, CDC73, CDH1, CDK4, CDKN1B, CDKN1C, CDKN2A (p14ARF), CDKN2A (p16INK4a), CEBPA, CFTR*, CHEK2, CPA1, CTNNA1, CTRC, DICER1*, DIS3L2, EGFR, EPCAM*, FANCC, FH*, FLCN, GATA2, GPC3*, GREM1*, HOXB13, HRAS, KIT, MAX*, MEN1*, MET*, MITF*, MLH1*, MSH2*, MSH3*, MSH6*, MUTYH, NBN, NF1*, NF2, NTHL1, PALB2, PALLD, PDGFRA, PHOX2B*, PMS2*, POLD1*, POLE, POT1, PRKAR1A, PRSS1*, PTCH1, PTEN*, RAD50, RAD51C, RAD51D, RB1*, RECQL4*, RET, RNF43, RUNX1, SDHA*, SDHAF2, SDHB, SDHC*, SDHD, SMAD4, SMARCA4, SMARCB1, SMARCE1, SPINK1, STK11, SUFU, TERC, TERT, TMEM127, TP53, TSC1*, TSC2, VHL, WRN*, and WT1. The test report has been scanned into EPIC and is located under the Molecular Pathology section of the Results Review tab.  A portion of the result report is included below for reference.     We discussed with Mr. Rolfson that because current genetic testing is not perfect, it is possible there may be a gene mutation in one of these genes that current testing cannot detect, but that chance is small.  We also discussed, that there could be another gene that has not yet been discovered, or that we have not yet tested, that is responsible for the cancer diagnoses in the family. It is also possible there is a hereditary cause for the cancer in the family that Mr. Scherger did not inherit and therefore was not identified in his testing.  Therefore, it is important to remain in touch with cancer genetics in the future so that we can continue to offer Mr. Murdock the most  up to date genetic testing.   Genetic testing did identify two Variants of uncertain significance (VUS) - one in the BAP1 gene called c.123-3C>T (Intronic) and a second in the RUNX1 gene called c.1184C>G (p.Pro395Arg) .  At this time, it is unknown if these variants are associated with increased cancer risk or if they are normal findings, but most variants such as these get reclassified to being inconsequential. They should not be used to make medical management decisions. With time, we suspect the lab will determine the significance of these variants, if any. If we do learn more about them, we will try to contact Mr. Macari to discuss it further. However, it is important to stay in touch with [place] periodically and keep the address and phone number up to date.  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  The amended report date is August 12, 2021.  ADDITIONAL GENETIC TESTING: We discussed with Mr. Traweek that his genetic testing was fairly extensive.  If there are genes identified to increase cancer risk that can be analyzed in the future, we would be happy to discuss and coordinate this testing at that time.    CANCER SCREENING RECOMMENDATIONS: Mr. Rewerts test result is considered negative (normal).  This means that we have not identified a hereditary cause for his family history of pancreatic cancer at this time. Most cancers happen by chance and this negative test suggests that his cancer may fall into this category.    While reassuring, this does not definitively rule out a hereditary predisposition to cancer. It is still possible that there could be genetic mutations that are undetectable by current technology. There could be genetic mutations in genes that have not been tested or identified to increase cancer risk.  Therefore, it is recommended he continue to follow the cancer management and screening guidelines provided by his primary healthcare provider.   An  individual's cancer risk and medical management are not determined by genetic test results alone. Overall cancer risk assessment incorporates additional factors, including personal medical history, family history, and any available genetic information that may result in a personalized plan for cancer prevention and surveillance.  We discussed a referral for pancreatic cancer screening, based on both parents having this diagnosis.  I explained that typically individuals with two close family members with pancreatic cancer meet medical criteria for testing. Generally, it is assumed that the family members are on one side of the family rather than both sides.  However, based on both parents having this diagnosis, a conversation about pancreatic cancer screening may be warranted.  Mr. Ivery declined this referral. If he would like to have this referral in the future, please reach out to our office.  RECOMMENDATIONS FOR FAMILY MEMBERS:  Individuals in this family might be at some increased risk of developing cancer, over the general population risk, simply due to the family history of cancer.  We recommended women in this family have a yearly mammogram beginning  at age 39, or 85 years younger than the earliest onset of cancer, an annual clinical breast exam, and perform monthly breast self-exams. Women in this family should also have a gynecological exam as recommended by their primary provider. All family members should be referred for colonoscopy starting at age 54.  It is also possible there is a hereditary cause for the cancer in Mr. Pippins's family that he did not inherit and therefore was not identified in him.  Based on Mr. Mulrooney's family history, we recommended his mother, who was diagnosed with a pancreatic neuroendocrine tumor at age 62, have genetic counseling and testing.  Based on his father's Hopkins age of onset of pancreatic cancer, Mr. Georg brother should consider genetic testing as well.  Mr.  Zuercher will let us know if we can be of any assistance in coordinating genetic counseling and/or testing for this family member.   FOLLOW-UP: Lastly, we discussed with Mr. Arreola that cancer genetics is a rapidly advancing field and it is possible that new genetic tests will be appropriate for him and/or his family members in the future. We encouraged him to remain in contact with cancer genetics on an annual basis so we can update his personal and family histories and let him know of advances in cancer genetics that may benefit this family.   Our contact number was provided. Mr. Langan questions were answered to his satisfaction, and he knows he is welcome to call us at anytime with additional questions or concerns.   Roma Kayser, [place], [facility] Licensed, Certified Genetic Counselor [email or web address]

## 2020-08-20 NOTE — Telephone Encounter (Signed)
Revealed negative genetic testing on the reflexed panel.  Discussed that we do not know why there is cancer in the family. It could be due to a different gene that we are not testing, or maybe our current technology may not be able to pick something up.  It will be important for him to keep in contact with genetics to keep up with whether additional testing may be needed.  Two VUS identified - one in BAP1 and the other in RUNX1.  These will not change medical management.

## 2020-08-20 NOTE — Telephone Encounter (Signed)
Revealed negative genetic testing.  Discussed that we do not know why there is cancer in the family. It could be due to a different gene that we are not testing, or maybe our current technology may not be able to pick something up.  It will be important for him to keep in contact with genetics to keep up with whether additional testing may be needed.  Patient reported that his mother had a PNET and his father died of an adenocarcinoma pancreatic cancer at 83 (not 29).  We will add on a few more genes to ensure that we have captured all genes associated for neuroendocrine tumors.

## 2020-12-17 ENCOUNTER — Other Ambulatory Visit: Payer: Managed Care, Other (non HMO)

## 2020-12-17 DIAGNOSIS — Z20822 Contact with and (suspected) exposure to covid-19: Secondary | ICD-10-CM

## 2020-12-19 LAB — SARS-COV-2, NAA 2 DAY TAT

## 2020-12-19 LAB — NOVEL CORONAVIRUS, NAA: SARS-CoV-2, NAA: NOT DETECTED

## 2021-08-18 ENCOUNTER — Encounter: Payer: Self-pay | Admitting: Genetic Counselor

## 2022-01-31 ENCOUNTER — Other Ambulatory Visit: Payer: Self-pay

## 2022-01-31 ENCOUNTER — Telehealth: Payer: Managed Care, Other (non HMO) | Admitting: Physician Assistant

## 2022-01-31 ENCOUNTER — Ambulatory Visit
Admission: EM | Admit: 2022-01-31 | Discharge: 2022-01-31 | Disposition: A | Discharge status: Home / Self Care | Payer: Managed Care, Other (non HMO) | Attending: Emergency Medicine | Admitting: Emergency Medicine

## 2022-01-31 DIAGNOSIS — R2689 Other abnormalities of gait and mobility: Secondary | ICD-10-CM

## 2022-01-31 DIAGNOSIS — R519 Headache, unspecified: Secondary | ICD-10-CM

## 2022-01-31 DIAGNOSIS — R5383 Other fatigue: Secondary | ICD-10-CM

## 2022-01-31 DIAGNOSIS — R42 Dizziness and giddiness: Secondary | ICD-10-CM

## 2022-01-31 DIAGNOSIS — R112 Nausea with vomiting, unspecified: Secondary | ICD-10-CM

## 2022-01-31 NOTE — Discharge Instructions (Addendum)
Neurologically, you seem to be well intact.    Because you are having overlapping symptoms of headache, nausea, vomiting, fatigue and dizziness, I recommended COVID testing.  The results of that test will be made available to your MyChart account once complete which typically takes 24 to 48 hours.  If it is positive, you will be contacted by phone and I will be happy to prescribe you Paxlovid for treatment.  Please continue to monitor your symptoms and let your primary care provider know if you do not continue on the path of improvement that you are currently on.  It is infectious in origin, I anticipate your symptoms will resolve in the next 3 to 4 days.  Thank you for visiting urgent care today.  We appreciate the opportunity to participate in your care.

## 2022-01-31 NOTE — ED Triage Notes (Signed)
Pt reports having a headahce that began Friday, Saturday he began having n/v. He states the nausea and headache are worse while driving. He reports having dizziness and loss of appetite.

## 2022-01-31 NOTE — ED Provider Notes (Signed)
[facility]    CSN: [id number] Arrival date & time: 01/31/22  1145    HISTORY   Chief Complaint  Patient presents with   Dizziness   Nausea   HPI Earl Ortiz is a 36 y.o. male. Patient complains of a right temporal headache that began 3 days ago which has been persistent.  Patient states that 2 days ago he began to have nausea and vomiting, states that this occurred in the morning while he was driving and that he had to pull off twice to vomit, has not vomited since then but continues to have nausea while driving long distances.  Patient states that headache is easily resolved with Tylenol, relief of headache resolves the nausea but after the Tylenol wears off the headache and nausea, right back.  Patient states the loss of balance is constant but he is not dizzy, does not have a room spinning sensation, does not feel lightheaded.  Patient states symptoms are worse in the morning.  Patient denies head trauma, ear pain, tinnitus, hearing loss, abdominal pain, increased blood pressure.  Patient endorses a history of seasonal allergies, not currently taking anything for his allergies but has been prescribed Allegra-D in the past.  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    The history is provided by the patient.  Past Medical History:  Diagnosis Date   Asthma    Family history of pancreatic cancer    Patient Active Problem List   Diagnosis Date Noted   Genetic testing 08/18/2020   Family history of pancreatic cancer    Allergic reaction 02/08/2016   History of asthma 02/08/2016   Angioedema 02/08/2016   Past Surgical History:  Procedure Laterality Date   left shoulder surgery     WISDOM TOOTH EXTRACTION      Home Medications    Prior to Admission medications   Medication Sig Start Date End Date Taking? Authorizing Provider  DiphenhydrAMINE HCl (BENADRYL ALLERGY PO) Take by mouth.    [provider]   EPINEPHrine 0.3 mg/0.3 mL IJ SOAJ injection Inject 0.3 mLs (0.3 mg total) into the muscle once. 02/08/16   [name], [name], MD  febuxostat (ULORIC) 40 MG tablet Take 40 mg by mouth daily.    [provider]  fexofenadine-pseudoephedrine (ALLEGRA-D 24) 180-240 MG 24 hr tablet Take 1 tablet by mouth daily.    [provider]  ibuprofen (ADVIL,MOTRIN) 600 MG tablet Take 1 tablet (600 mg total) by mouth every 6 (six) hours as needed. 04/14/17   Joy, Shawn C, PA-C  oxymetazoline (AFRIN) 0.05 % nasal spray Place 1 spray into both nostrils 2 (two) times daily.    [provider]    Family History Family History  Problem Relation Age of Onset   Allergic rhinitis Brother    Pancreatic cancer Mother 75       PNET   Pancreatic cancer Father 76       Adenocarcioma   Diabetes Maternal Uncle    Stroke Maternal Grandmother    Diabetes Maternal Grandfather    Lung cancer Paternal Grandmother        smoker   Healthy Son    Healthy Daughter    Asthma Neg Hx    Eczema Neg Hx    Urticaria Neg Hx    Social History Social History   Tobacco Use   Smoking status: Never   Smokeless tobacco: Never  Substance Use Topics  Alcohol use: No    Alcohol/week: 0.0 standard drinks   Drug use: No   Allergies   Patient has no known allergies.  Review of Systems Review of Systems Pertinent findings noted in history of present illness.   Physical Exam Triage Vital Signs ED Triage Vitals  Enc Vitals Group     BP 10/08/21 0827 (!) 147/82     Pulse Rate 10/08/21 0827 72     Resp 10/08/21 0827 18     Temp 10/08/21 0827 98.3 F (36.8 C)     Temp Source 10/08/21 0827 Oral     SpO2 10/08/21 0827 98 %     Weight --      Height --      Head Circumference --      Peak Flow --      Pain Score 10/08/21 0826 5     Pain Loc --      Pain Edu? --      Excl. in [place]? --   No data found.  Updated Vital Signs BP (!) 135/92 (BP Location: Left Arm)    Pulse 95    Temp 98.5 F  (36.9 C) (Oral)    Resp 20    SpO2 98%   Physical Exam Vitals and nursing note reviewed.  Constitutional:      General: He is not in acute distress.    Appearance: Normal appearance. He is not ill-appearing.  HENT:     Head: Normocephalic and atraumatic.  Eyes:     General: Lids are normal.        Right eye: No discharge.        Left eye: No discharge.     Extraocular Movements: Extraocular movements intact.     Conjunctiva/sclera: Conjunctivae normal.     Right eye: Right conjunctiva is not injected.     Left eye: Left conjunctiva is not injected.  Neck:     Trachea: Trachea and phonation normal.  Cardiovascular:     Rate and Rhythm: Normal rate and regular rhythm.     Pulses: Normal pulses.     Heart sounds: Normal heart sounds. No murmur heard.   No friction rub. No gallop.  Pulmonary:     Effort: Pulmonary effort is normal. No accessory muscle usage, prolonged expiration or respiratory distress.     Breath sounds: Normal breath sounds. No stridor, decreased air movement or transmitted upper airway sounds. No decreased breath sounds, wheezing, rhonchi or rales.  Chest:     Chest wall: No tenderness.  Musculoskeletal:        General: Normal range of motion.     Cervical back: Normal range of motion and neck supple. Normal range of motion.  Lymphadenopathy:     Cervical: No cervical adenopathy.  Skin:    General: Skin is warm and dry.     Findings: No erythema or rash.  Neurological:     General: No focal deficit present.     Mental Status: He is alert and oriented to person, place, and time. Mental status is at baseline.     Cranial Nerves: Cranial nerves 2-12 are intact.     Sensory: Sensation is intact.     Motor: Motor function is intact.     Coordination: Coordination is intact.     Deep Tendon Reflexes: Reflexes are normal and symmetric.  Psychiatric:        Mood and Affect: Mood normal.        Behavior: Behavior normal.  Visual Acuity Right Eye Distance:    Left Eye Distance:   Bilateral Distance:    Right Eye Near:   Left Eye Near:    Bilateral Near:     UC Couse / Diagnostics / Procedures:    EKG  Radiology No results found.  Procedures Procedures (including critical care time)  UC Diagnoses / Final Clinical Impressions(s)   I have reviewed the triage vital signs and the nursing notes.  Pertinent labs & imaging results that were available during my care of the patient were reviewed by me and considered in my medical decision making (see chart for details).    Final diagnoses:  Loss of balance  Acute nonintractable headache, unspecified headache type  Other fatigue  Nausea and vomiting, unspecified vomiting type   Neurological exam is negative.  Patient tested for COVID given symptoms of headache, nausea, vomiting, fatigue and dizziness with Paxlovid if positive.  Patient advised to follow-up with primary care if symptoms do not continue to improve or begin to worsen again.  Okay to continue Tylenol.  ED Prescriptions   None    PDMP not reviewed this encounter.  Pending results:  Labs Reviewed  NOVEL CORONAVIRUS, NAA    Medications Ordered in UC: Medications - No data to display  Disposition Upon Discharge:  Condition: stable for discharge home Home: take medications as prescribed; routine discharge instructions as discussed; follow up as advised.  Patient presented with an acute illness with associated systemic symptoms and significant discomfort requiring urgent management. In my opinion, this is a condition that a prudent lay person (someone who possesses an average knowledge of health and medicine) may potentially expect to result in complications if not addressed urgently such as respiratory distress, impairment of bodily function or dysfunction of bodily organs.   Routine symptom specific, illness specific and/or disease specific instructions were discussed with the patient and/or caregiver at length.   As  such, the patient has been evaluated and assessed, work-up was performed and treatment was provided in alignment with urgent care protocols and evidence based medicine.  Patient/parent/caregiver has been advised that the patient may require follow up for further testing and treatment if the symptoms continue in spite of treatment, as clinically indicated and appropriate.  If the patient was tested for COVID-19, Influenza and/or RSV, then the patient/parent/guardian was advised to isolate at home pending the results of his/her diagnostic coronavirus test and potentially longer if theyre positive. I have also advised pt that if his/her COVID-19 test returns positive, it's recommended to self-isolate for at least 10 days after symptoms first appeared AND until fever-free for 24 hours without fever reducer AND other symptoms have improved or resolved. Discussed self-isolation recommendations as well as instructions for household member/close contacts as per the [facility] and [place] DHHS, and also gave patient the [place] packet with this information.  Patient/parent/caregiver has been advised to return to the [facility] or PCP in 3-5 days if no better; to PCP or the Emergency Department if new signs and symptoms develop, or if the current signs or symptoms continue to change or worsen for further workup, evaluation and treatment as clinically indicated and appropriate  The patient will follow up with their current PCP if and as advised. If the patient does not currently have a PCP we will assist them in obtaining one.   The patient may need specialty follow up if the symptoms continue, in spite of conservative treatment and management, for further workup, evaluation, consultation and treatment as clinically  indicated and appropriate.   Patient/parent/caregiver verbalized understanding and agreement of plan as discussed.  All questions were addressed during visit.  Please see discharge instructions below for further details of  plan.  Discharge Instructions:   Discharge Instructions      Neurologically, you seem to be well intact.    Because you are having overlapping symptoms of headache, nausea, vomiting, fatigue and dizziness, I recommended COVID testing.  The results of that test will be made available to your MyChart account once complete which typically takes 24 to 48 hours.  If it is positive, you will be contacted by phone and I will be happy to prescribe you Paxlovid for treatment.  Please continue to monitor your symptoms and let your primary care provider know if you do not continue on the path of improvement that you are currently on.  It is infectious in origin, I anticipate your symptoms will resolve in the next 3 to 4 days.  Thank you for visiting urgent care today.  We appreciate the opportunity to participate in your care.    This office note has been dictated using Museum/gallery curator.  Unfortunately, and despite my best efforts, this method of dictation can sometimes lead to occasional typographical or grammatical errors.  I apologize in advance if this occurs.     Lynden Oxford Scales, PA-C 01/31/22 1228

## 2022-01-31 NOTE — Progress Notes (Signed)
Based on what you shared with me, I feel your condition warrants further evaluation and I recommend that you be seen in a face to face visit.  With these symptoms being brought on by a headache, I do feel it is safest if you are evaluated in person.    NOTE: There will be NO CHARGE for this eVisit   If you are having a true medical emergency please call 911.      For an urgent face to face visit, [facility] has six urgent care centers for your convenience:     [facility] [place] Get Driving Directions [phone] [place], [place] [address]    [facility]) Get Driving Directions [phone] [place], [place] [address]  [facility] [place] ([place]) Get Driving Directions [phone] [address] [place],  [place]  [address]  [facility] Get Driving Directions [phone] [address] [place], [place], [place] [address]   [facility] Get Driving Directions  [phone] [address] [place], [place] [address]   [facility] [place] Get Driving Directions [phone] [address], [place], [place] [address]  Your MyChart E-visit questionnaire answers were reviewed by a board certified advanced clinical practitioner to complete your personal care plan based on your specific symptoms.  Thank you for using e-Visits.   I provided 5 minutes of non face-to-face time during this encounter for chart review and documentation.

## 2022-02-01 LAB — NOVEL CORONAVIRUS, NAA: SARS-CoV-2, NAA: NOT DETECTED

## 2022-02-03 ENCOUNTER — Other Ambulatory Visit: Payer: Self-pay

## 2022-02-03 ENCOUNTER — Ambulatory Visit (HOSPITAL_BASED_OUTPATIENT_CLINIC_OR_DEPARTMENT_OTHER): Payer: Managed Care, Other (non HMO) | Admitting: Family Medicine

## 2022-02-03 ENCOUNTER — Encounter (HOSPITAL_BASED_OUTPATIENT_CLINIC_OR_DEPARTMENT_OTHER): Payer: Self-pay | Admitting: Family Medicine

## 2022-02-03 VITALS — BP 120/92 | HR 81 | Ht 76.0 in | Wt 305.0 lb

## 2022-02-03 DIAGNOSIS — R519 Headache, unspecified: Secondary | ICD-10-CM

## 2022-02-03 DIAGNOSIS — Z0000 Encounter for general adult medical examination without abnormal findings: Secondary | ICD-10-CM

## 2022-02-03 DIAGNOSIS — M109 Gout, unspecified: Secondary | ICD-10-CM | POA: Diagnosis not present

## 2022-02-03 DIAGNOSIS — E669 Obesity, unspecified: Secondary | ICD-10-CM | POA: Diagnosis not present

## 2022-02-03 NOTE — Patient Instructions (Signed)
°  Medication Instructions:  Your physician recommends that you continue on your current medications as directed. Please refer to the Current Medication list given to you today. --If you need a refill on any your medications before your next appointment, please call your pharmacy first. If no refills are authorized on file call the office.-- Follow-Up: Your next appointment:   Your physician recommends that you schedule a follow-up appointment in: 2 MONTHS for CPE with Dr. de [place]  You will receive a text message or e-mail with a link to a survey about your care and experience with [place] today! We would greatly appreciate your feedback!   Thanks for letting us be apart of your health journey!!  Primary Care and Sports Medicine   Dr. Arlina Robes [place]   We encourage you to activate your patient portal called "MyChart".  Sign up information is provided on this After Visit Summary.  MyChart is used to connect with patients for Virtual Visits (Telemedicine).  Patients are able to view lab/test results, encounter notes, upcoming appointments, etc.  Non-urgent messages can be sent to your provider as well. To learn more about what you can do with MyChart, please visit --  [email or web address].

## 2022-02-03 NOTE — Assessment & Plan Note (Signed)
Largely resolved with only mild symptoms remaining Uncertain etiology based on history, did have normal exam findings and urgent care evaluation At present, feel that we can monitor symptoms, advised to return to the office should they recur

## 2022-02-03 NOTE — Progress Notes (Signed)
New Patient Office Visit  Subjective:  Patient ID: Abednego Yeates, male    DOB: 03-02-1986  Age: 36 y.o. MRN: [id number]  CC:  Chief Complaint  Patient presents with   Establish Care    Prior PCP - [facility]   Headache    Patient presents today with concerns of an acute onset headache that presented Saturday. He was seen in the ED for symptoms of headache, nausea, vomiting, and loss of balance. He states he has felt some relief in the headache after going to the chiropractor yesterday. He was also evaluated by another physician and was told it wasn't recommended at the time for him to have any CT/MRI scans    HPI Dvon Jiles is a 36 year old male presenting to establish in clinic.  He has current concerns as outlined above.  Reports past medical history of gout.  Last time he saw his PCP was at least 1 year ago.  Headache: Had recent issue with headache, had associated nausea, vomiting, dizziness.  Symptoms are also worse when driving.  Had some feeling of being unsteady on his feet, trouble with balancing.  He was seen at urgent care and per documentation, clinical exam was benign.  He did not have any imaging at that time.  Today he reports that he is feeling much better in this regard, no longer has any vomiting, headache is very mild.  Feels that his balance is back to normal, he did not have any issue with driving here today.  He did go to a chiropractor yesterday and had an adjustment.  Denies any prior issues with headaches or migraines.  Gout: Patient indicates that he follows with podiatry, currently takes Uloric.  Denies any current issues related to gout.  Patient is originally from [place], also lived in [place].  He has been here since 2016.  He works as a Audiological scientist, was with [employer], now working with [employer].  Outside of work he primarily spends time with family, has 3 kids at home.  Will go for walks.  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   Asthma    Family history of pancreatic cancer    Sleep apnea    Diagnosed by the [facility] this past winter    Past Surgical History:  Procedure Laterality Date   FRACTURE SURGERY     Left shoulder in 5th or 6th grade   left shoulder surgery     WISDOM TOOTH EXTRACTION      Family History  Problem Relation Age of Onset   Allergic rhinitis Brother    Depression Brother    Pancreatic cancer Mother 35       PNET   Cancer Mother    Depression Mother    Miscarriages / [place] Mother    Pancreatic cancer Father 80       Adenocarcioma   Cancer Father    Diabetes Maternal Uncle    Stroke Maternal Grandmother    Diabetes Maternal Grandfather    Lung cancer Paternal Grandmother        smoker   Healthy Son    Healthy Daughter    Asthma Neg Hx    Eczema Neg Hx    Urticaria Neg Hx     Social History   Socioeconomic History   Marital status: Married    Spouse name: Not on file   Number of children: Not on file  Years of education: Not on file   Highest education level: Not on file  Occupational History   Not on file  Tobacco Use   Smoking status: Never   Smokeless tobacco: Never  Vaping Use   Vaping Use: Never used  Substance and Sexual Activity   Alcohol use: No   Drug use: No   Sexual activity: Yes    Birth control/protection: I.U.D.  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   Social Determinants of Health   Financial Resource Strain: Not on file  Food Insecurity: Not on file  Transportation Needs: Not on file  Physical Activity: Not on file  Stress: Not on file  Social Connections: Not on file  Intimate Partner Violence: Not on file    Objective:   Today's Vitals: BP (!) 120/92    Pulse 81    Ht 6\' 4"  (1.93 m)    Wt (!) 305 lb (138.3 kg)    SpO2 98%    BMI 37.13 kg/m   Physical Exam  36 year old male in no acute distress Cardiovascular exam with  regular rate and rhythm, no murmur appreciated Lungs clear to auscultation bilaterally  Assessment & Plan:   Problem List Items Addressed This Visit       Other   Headache - Primary    Largely resolved with only mild symptoms remaining Uncertain etiology based on history, did have normal exam findings and urgent care evaluation At present, feel that we can monitor symptoms, advised to return to the office should they recur      Obesity, Class I, BMI 30-34.9    Patient aware of elevated BMI, has been working on lifestyle modifications We will continue to monitor patient's weight loss attempts, provide assistance as desired, review upcoming labs in relation to elevated BMI      Gout    Currently manages with Uloric, managed by podiatry No current issues, continue to monitor      Other Visit Diagnoses     General medical exam       Relevant Orders   CBC with Differential/Platelet   Comprehensive metabolic panel   Hemoglobin A1c   Lipid panel   TSH Rfx on Abnormal to Free T4       Outpatient Encounter Medications as of 02/03/2022  Medication Sig   DiphenhydrAMINE HCl (BENADRYL ALLERGY PO) Take by mouth.   EPINEPHrine 0.3 mg/0.3 mL IJ SOAJ injection Inject 0.3 mLs (0.3 mg total) into the muscle once.   febuxostat (ULORIC) 40 MG tablet Take 40 mg by mouth daily.   fexofenadine-pseudoephedrine (ALLEGRA-D 24) 180-240 MG 24 hr tablet Take 1 tablet by mouth daily.   [DISCONTINUED] ibuprofen (ADVIL,MOTRIN) 600 MG tablet Take 1 tablet (600 mg total) by mouth every 6 (six) hours as needed.   [DISCONTINUED] oxymetazoline (AFRIN) 0.05 % nasal spray Place 1 spray into both nostrils 2 (two) times daily.   No facility-administered encounter medications on file as of 02/03/2022.    Follow-up: Return in about 2 months (around 04/03/2022) for Follow Up.  Plan for follow-up in about 2 months for CPE, labs to be completed about 1 week prior  Geralda Baumgardner J De [place], MD

## 2022-02-03 NOTE — Assessment & Plan Note (Signed)
Currently manages with Uloric, managed by podiatry No current issues, continue to monitor

## 2022-02-03 NOTE — Assessment & Plan Note (Signed)
Patient aware of elevated BMI, has been working on lifestyle modifications We will continue to monitor patient's weight loss attempts, provide assistance as desired, review upcoming labs in relation to elevated BMI

## 2022-02-25 ENCOUNTER — Other Ambulatory Visit: Payer: Self-pay

## 2022-02-25 ENCOUNTER — Ambulatory Visit
Admission: EM | Admit: 2022-02-25 | Discharge: 2022-02-25 | Disposition: A | Discharge status: Home / Self Care | Payer: Managed Care, Other (non HMO) | Attending: Emergency Medicine | Admitting: Emergency Medicine

## 2022-02-25 DIAGNOSIS — H6983 Other specified disorders of Eustachian tube, bilateral: Secondary | ICD-10-CM

## 2022-02-25 DIAGNOSIS — J9801 Acute bronchospasm: Secondary | ICD-10-CM

## 2022-02-25 DIAGNOSIS — J302 Other seasonal allergic rhinitis: Secondary | ICD-10-CM

## 2022-02-25 MED ORDER — FLUTICASONE PROPIONATE 50 MCG/ACT NA SUSP: 2.0000 | Freq: Every day | NASAL | 1 refills | Status: DC

## 2022-02-25 MED ORDER — FEXOFENADINE HCL 180 MG PO TABS: 180.0000 mg | ORAL_TABLET | Freq: Every day | ORAL | 0 refills | Status: DC

## 2022-02-25 MED ORDER — METHYLPREDNISOLONE SODIUM SUCC 125 MG IJ SOLR
125.0000 mg | Freq: Once | INTRAMUSCULAR | Status: AC
  Administered 2022-02-25: 125 mg via INTRAMUSCULAR

## 2022-02-25 NOTE — ED Triage Notes (Signed)
Pt c/o fever, cough, runny nose and irritation to right ear that began this week. ?

## 2022-02-25 NOTE — ED Provider Notes (Signed)
[facility]    CSN: [id number] Arrival date & time: 02/25/22  1036    HISTORY   Chief Complaint  Patient presents with   Cough   Nasal Congestion   Otalgia   HPI Earl Ortiz is a 36 y.o. male. Pt c/o fever in his right ear with a normal temperature in his left, cough, runny nose and irritation to right ear that began this week.  Patient states that he typically has to take allergy medication during springtime, states he has not started it yet this year.  Patient states the cough is not productive but feels tight.  Patient states he is taken Tylenol only at this time.  The history is provided by the patient.  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   Asthma    Family history of pancreatic cancer    Sleep apnea    Diagnosed by the [facility] this past winter   Patient Active Problem List   Diagnosis Date Noted   Headache 02/03/2022   Obesity, Class I, BMI 30-34.9 02/03/2022   Gout 02/03/2022   Genetic testing 08/18/2020   Family history of pancreatic cancer    Allergic reaction 02/08/2016   History of asthma 02/08/2016   Angioedema 02/08/2016   Past Surgical History:  Procedure Laterality Date   FRACTURE SURGERY     Left shoulder in 5th or 6th grade   left shoulder surgery     WISDOM TOOTH EXTRACTION      Home Medications    Prior to Admission medications   Medication Sig Start Date End Date Taking? Authorizing Provider  EPINEPHrine 0.3 mg/0.3 mL IJ SOAJ injection Inject 0.3 mLs (0.3 mg total) into the muscle once. 02/08/16   [name], Heywood Iles, MD  febuxostat (ULORIC) 40 MG tablet Take 40 mg by mouth daily.    [provider]   Family History Family History  Problem Relation Age of Onset   Allergic rhinitis Brother    Depression Brother    Pancreatic cancer Mother 57       PNET   Cancer Mother    Depression Mother    Miscarriages / [place] Mother    Pancreatic cancer Father 64        Adenocarcioma   Cancer Father    Diabetes Maternal Uncle    Stroke Maternal Grandmother    Diabetes Maternal Grandfather    Lung cancer Paternal Grandmother        smoker   Healthy Son    Healthy Daughter    Asthma Neg Hx    Eczema Neg Hx    Urticaria Neg Hx    Social History Social History   Tobacco Use   Smoking status: Never   Smokeless tobacco: Never  Vaping Use   Vaping Use: Never used  Substance Use Topics   Alcohol use: No   Drug use: No   Allergies   Ethanol  Review of Systems Review of Systems Pertinent findings noted in history of present illness.   Physical Exam Triage Vital Signs ED Triage Vitals  Enc Vitals Group     BP 10/08/21 0827 (!) 147/82     Pulse Rate 10/08/21 0827 72     Resp 10/08/21 0827 18     Temp 10/08/21 0827 98.3 F (36.8 C)     Temp Source 10/08/21 0827 Oral     SpO2 10/08/21 0827 98 %     Weight --  Height --      Head Circumference --      Peak Flow --      Pain Score 10/08/21 0826 5     Pain Loc --      Pain Edu? --      Excl. in GC? --   No data found.  Updated Vital Signs BP 125/90 (BP Location: Right Arm)   Pulse 76   Temp 98.7 F (37.1 C) (Oral)   Resp 20   SpO2 98%   Physical Exam Vitals and nursing note reviewed. Exam conducted with a chaperone present.  Constitutional:      General: He is awake. He is not in acute distress.    Appearance: Normal appearance. He is well-developed and well-groomed. He is obese. He is not ill-appearing.  HENT:     Head: Normocephalic and atraumatic.     Salivary Glands: Right salivary gland is not diffusely enlarged or tender. Left salivary gland is not diffusely enlarged or tender.     Right Ear: Hearing, ear canal and external ear normal. No drainage. A middle ear effusion is present. There is no impacted cerumen. Tympanic membrane is bulging. Tympanic membrane is not injected or erythematous.     Left Ear: Hearing, ear canal and external ear normal. No drainage. A middle  ear effusion is present. There is no impacted cerumen. Tympanic membrane is bulging. Tympanic membrane is not injected or erythematous.     Ears:     Comments: Bilateral EACs normal, both TMs bulging with clear fluid    Nose: Rhinorrhea present. No nasal deformity, septal deviation, signs of injury, nasal tenderness, mucosal edema or congestion. Rhinorrhea is clear.     Right Nostril: Occlusion present. No foreign body, epistaxis or septal hematoma.     Left Nostril: Occlusion present. No foreign body, epistaxis or septal hematoma.     Right Turbinates: Enlarged, swollen and pale.     Left Turbinates: Enlarged, swollen and pale.     Right Sinus: No maxillary sinus tenderness or frontal sinus tenderness.     Left Sinus: No maxillary sinus tenderness or frontal sinus tenderness.     Mouth/Throat:     Lips: Pink. No lesions.     Mouth: Mucous membranes are moist. No oral lesions.     Pharynx: Oropharynx is clear. Uvula midline. No posterior oropharyngeal erythema or uvula swelling.     Tonsils: No tonsillar exudate. 0 on the right. 0 on the left.     Comments: Postnasal drip Eyes:     General: Lids are normal.        Right eye: No discharge.        Left eye: No discharge.     Extraocular Movements: Extraocular movements intact.     Conjunctiva/sclera: Conjunctivae normal.     Right eye: Right conjunctiva is not injected.     Left eye: Left conjunctiva is not injected.  Neck:     Trachea: Trachea and phonation normal.  Cardiovascular:     Rate and Rhythm: Normal rate and regular rhythm.     Pulses: Normal pulses.     Heart sounds: Normal heart sounds. No murmur heard.   No friction rub. No gallop.  Pulmonary:     Effort: Pulmonary effort is normal. No tachypnea, bradypnea, accessory muscle usage, prolonged expiration, respiratory distress or retractions.     Breath sounds: Normal breath sounds. No stridor, decreased air movement or transmitted upper airway sounds. No decreased breath  sounds, wheezing, rhonchi  or rales.     Comments: Bronchospasm with cough Chest:     Chest wall: No tenderness.  Musculoskeletal:        General: Normal range of motion.     Cervical back: Normal range of motion and neck supple. Normal range of motion.  Lymphadenopathy:     Cervical: No cervical adenopathy.  Skin:    General: Skin is warm and dry.     Findings: No erythema or rash.  Neurological:     General: No focal deficit present.     Mental Status: He is alert and oriented to person, place, and time.  Psychiatric:        Mood and Affect: Mood normal.        Behavior: Behavior normal. Behavior is cooperative.    Visual Acuity Right Eye Distance:   Left Eye Distance:   Bilateral Distance:    Right Eye Near:   Left Eye Near:    Bilateral Near:     UC Couse / Diagnostics / Procedures:    EKG  Radiology No results found.  Procedures Procedures (including critical care time)  UC Diagnoses / Final Clinical Impressions(s)   I have reviewed the triage vital signs and the nursing notes.  Pertinent labs & imaging results that were available during my care of the patient were reviewed by me and considered in my medical decision making (see chart for details).   Final diagnoses:  Seasonal allergic rhinitis, unspecified trigger  Bronchospasm  Dysfunction of both eustachian tubes   Recommend patient begin allergy medications at this time.  Patient provided with injection of methylprednisolone to rapidly relieve bronchospasm and eustachian tube dysfunction.  ED Prescriptions     Medication Sig Dispense Auth. Provider   fexofenadine (ALLEGRA) 180 MG tablet Take 1 tablet (180 mg total) by mouth daily. 180 tablet Theadora Rama Scales, PA-C   fluticasone (FLONASE) 50 MCG/ACT nasal spray Place 2 sprays into both nostrils daily. 48 mL Theadora Rama Scales, PA-C      PDMP not reviewed this encounter.  Pending results:  Labs Reviewed - No data to display  Medications  Ordered in UC: Medications  methylPREDNISolone sodium succinate (SOLU-MEDROL) 125 mg/2 mL injection 125 mg (has no administration in time range)    Disposition Upon Discharge:  Condition: stable for discharge home Home: take medications as prescribed; routine discharge instructions as discussed; follow up as advised.  Patient presented with an acute illness with associated systemic symptoms and significant discomfort requiring urgent management. In my opinion, this is a condition that a prudent lay person (someone who possesses an average knowledge of health and medicine) may potentially expect to result in complications if not addressed urgently such as respiratory distress, impairment of bodily function or dysfunction of bodily organs.   Routine symptom specific, illness specific and/or disease specific instructions were discussed with the patient and/or caregiver at length.   As such, the patient has been evaluated and assessed, work-up was performed and treatment was provided in alignment with urgent care protocols and evidence based medicine.  Patient/parent/caregiver has been advised that the patient may require follow up for further testing and treatment if the symptoms continue in spite of treatment, as clinically indicated and appropriate.  If the patient was tested for COVID-19, Influenza and/or RSV, then the patient/parent/guardian was advised to isolate at home pending the results of his/her diagnostic coronavirus test and potentially longer if theyre positive. I have also advised pt that if his/her COVID-19 test returns positive, it's  recommended to self-isolate for at least 10 days after symptoms first appeared AND until fever-free for 24 hours without fever reducer AND other symptoms have improved or resolved. Discussed self-isolation recommendations as well as instructions for household member/close contacts as per the [facility] and [facility], and also gave patient the COVID packet with this  information.  Patient/parent/caregiver has been advised to return to the [facility] or PCP in 3-5 days if no better; to PCP or the Emergency Department if new signs and symptoms develop, or if the current signs or symptoms continue to change or worsen for further workup, evaluation and treatment as clinically indicated and appropriate  The patient will follow up with their current PCP if and as advised. If the patient does not currently have a PCP we will assist them in obtaining one.   The patient may need specialty follow up if the symptoms continue, in spite of conservative treatment and management, for further workup, evaluation, consultation and treatment as clinically indicated and appropriate.  Patient/parent/caregiver verbalized understanding and agreement of plan as discussed.  All questions were addressed during visit.  Please see discharge instructions below for further details of plan.  Discharge Instructions:   Discharge Instructions      Your symptoms and my physical exam findings are concerning for exacerbation of your underlying allergies.  It is important that you are consistent with taking allergy medications exactly as prescribed.     Methylprednisolone IM (Solu-Medrol):  To quickly address your significant respiratory inflammation, you were provided with an injection of methylprednisolone in the office today.  You should continue to feel the full benefit of the steroid for the next 4 to 6 hours.    Allegra (fexofenadine): This is an excellent second-generation antihistamine that helps to reduce respiratory inflammatory response to environmental allergens.  This medication is not known to cause daytime sleepiness so it can be taken in the daytime.  If you find that it does make you sleepy, please feel free to take it at bedtime.   Flonase (fluticasone): This is a steroid nasal spray that you use once daily, 1 spray in each nare.  This medication does not work well if you decide to  use it only used as you feel you need to, it works best used on a daily basis.  After 3 to 5 days of use, you will notice significant reduction of the inflammation and mucus production that is currently being caused by exposure to allergens, whether seasonal or environmental.  The most common side effect of this medication is nosebleeds.  If you experience a nosebleed, please discontinue use for 1 week, then feel free to resume.  I have provided you with a prescription but you can also purchase this medication over-the-counter if your insurance will not cover it.   Not taking your allergy medications on a regular basis increases your risk of more frequent upper respiratory infections that may or may not require the use of antibiotics, serious exacerbations that require the use of oral steroids, loss of time at work and missed social opportunities.   If you find that you have not had significant relief of your symptoms in the next 7 to 10 days, please follow-up with your primary care provider   Thank you for visiting urgent care today.  We appreciate the opportunity to participate in your care.         This office note has been dictated using Teaching laboratory technician.  Unfortunately, and despite my best efforts,  this method of dictation can sometimes lead to occasional typographical or grammatical errors.  I apologize in advance if this occurs.     Theadora Rama Scales, PA-C 02/25/22 1131

## 2022-02-25 NOTE — Discharge Instructions (Addendum)
Your symptoms and my physical exam findings are concerning for exacerbation of your underlying allergies.  It is important that you are consistent with taking allergy medications exactly as prescribed.   ?  ?Methylprednisolone IM (Solu-Medrol):  To quickly address your significant respiratory inflammation, you were provided with an injection of methylprednisolone in the office today.  You should continue to feel the full benefit of the steroid for the next 4 to 6 hours.  ?  ?Allegra (fexofenadine): This is an excellent second-generation antihistamine that helps to reduce respiratory inflammatory response to environmental allergens.  This medication is not known to cause daytime sleepiness so it can be taken in the daytime.  If you find that it does make you sleepy, please feel free to take it at bedtime. ?  ?Flonase (fluticasone): This is a steroid nasal spray that you use once daily, 1 spray in each nare.  This medication does not work well if you decide to use it only used as you feel you need to, it works best used on a daily basis.  After 3 to 5 days of use, you will notice significant reduction of the inflammation and mucus production that is currently being caused by exposure to allergens, whether seasonal or environmental.  The most common side effect of this medication is nosebleeds.  If you experience a nosebleed, please discontinue use for 1 week, then feel free to resume.  I have provided you with a prescription but you can also purchase this medication over-the-counter if your insurance will not cover it. ?  ?Not taking your allergy medications on a regular basis increases your risk of more frequent upper respiratory infections that may or may not require the use of antibiotics, serious exacerbations that require the use of oral steroids, loss of time at work and missed social opportunities. ?  ?If you find that you have not had significant relief of your symptoms in the next 7 to 10 days, please  follow-up with your primary care provider ?  ?Thank you for visiting urgent care today.  We appreciate the opportunity to participate in your care. ?  ? ?

## 2022-03-06 ENCOUNTER — Telehealth: Payer: Managed Care, Other (non HMO) | Admitting: Nurse Practitioner

## 2022-03-06 DIAGNOSIS — M1A9XX1 Chronic gout, unspecified, with tophus (tophi): Secondary | ICD-10-CM

## 2022-03-06 MED ORDER — FEBUXOSTAT 40 MG PO TABS: 40.0000 mg | ORAL_TABLET | Freq: Every day | ORAL | 0 refills | Status: DC

## 2022-03-06 NOTE — Progress Notes (Signed)
?Virtual Visit Consent  ? ?Albesa Seen, you are scheduled for a virtual visit with a [facility] provider today.   ?  ?Just as with appointments in the office, your consent must be obtained to participate.  Your consent will be active for this visit and any virtual visit you may have with one of our providers in the next 365 days.   ?  ?If you have a MyChart account, a copy of this consent can be sent to you electronically.  All virtual visits are billed to your insurance company just like a traditional visit in the office.   ? ?As this is a virtual visit, video technology does not allow for your provider to perform a traditional examination.  This may limit your provider's ability to fully assess your condition.  If your provider identifies any concerns that need to be evaluated in person or the need to arrange testing (such as labs, EKG, etc.), we will make arrangements to do so.   ?  ?Although advances in technology are sophisticated, we cannot ensure that it will always work on either your end or our end.  If the connection with a video visit is poor, the visit may have to be switched to a telephone visit.  With either a video or telephone visit, we are not always able to ensure that we have a secure connection.    ? ?I need to obtain your verbal consent now.   Are you willing to proceed with your visit today?  ?  ?Tory Septer has provided verbal consent on 03/06/2022 for a virtual visit (video or telephone). ?  ?Gildardo Pounds, NP  ? ?Date: 03/06/2022 7:31 PM ? ? ?Virtual Visit via Video Note  ? ?Earl Ortiz, connected with  Earl Ortiz  ([id number], 01/19/1986) on 03/06/22 at  7:30 PM EDT by a video-enabled telemedicine application and verified that I am speaking with the correct person using two identifiers. ? ?Location: ?Patient: Virtual Visit Location Patient: Home ?Provider: Virtual Visit Location Provider: Home Office ?  ?I discussed the limitations of evaluation and management by telemedicine  and the availability of in person appointments. The patient expressed understanding and agreed to proceed.   ? ?History of Present Illness: ?Earl Ortiz is a 36 y.o. who identifies as a male who was assigned male at birth, and is being seen today for GOUT. ? ?He has a chronic history of severe gout. Has been taking uloric for a few years. Ran out 2 days ago. Unfortunately his podiatry office has closed and he will need to establish with a new podiatrist. He states since being out of his medication his foot is starting to swell with mild pain.  ? ? ? ?Problems:  ?Patient Active Problem List  ? Diagnosis Date Noted  ? Headache 02/03/2022  ? Obesity, Class I, BMI 30-34.9 02/03/2022  ? Gout 02/03/2022  ? Genetic testing 08/18/2020  ? Family history of pancreatic cancer   ? Allergic reaction 02/08/2016  ? History of asthma 02/08/2016  ? Angioedema 02/08/2016  ?  ?Allergies:  ?Allergies  ?Allergen Reactions  ? Ethanol Anaphylaxis, Cough, Nausea Only and Rash  ? ?Medications:  ?Current Outpatient Medications:  ?  EPINEPHrine 0.3 mg/0.3 mL IJ SOAJ injection, Inject 0.3 mLs (0.3 mg total) into the muscle once., Disp: 2 Device, Rfl: 1 ?  febuxostat (ULORIC) 40 MG tablet, Take 1 tablet (40 mg total) by mouth daily. COURTESY REFILL for 30days, Disp: 30 tablet, Rfl: 0 ?  fexofenadine (ALLEGRA) 180 MG tablet, Take 1 tablet (180 mg total) by mouth daily., Disp: 180 tablet, Rfl: 0 ?  fluticasone (FLONASE) 50 MCG/ACT nasal spray, Place 2 sprays into both nostrils daily., Disp: 48 mL, Rfl: 1 ? ?Observations/Objective: ?Patient is well-developed, well-nourished in no acute distress.  ?Resting comfortably at home.  ?Head is normocephalic, atraumatic.  ?No labored breathing.  ?Speech is clear and coherent with logical content.  ?Patient is alert and oriented at baseline.  ? ? ?Assessment and Plan: ?1. Chronic gout with tophus, unspecified cause, unspecified site ?- febuxostat (ULORIC) 40 MG tablet; Take 1 tablet (40 mg total) by mouth  daily. COURTESY REFILL for 30days  Dispense: 30 tablet; Refill: 0 ? ? ?Follow Up Instructions: ?I discussed the assessment and treatment plan with the patient. The patient was provided an opportunity to ask questions and all were answered. The patient agreed with the plan and demonstrated an understanding of the instructions.  A copy of instructions were sent to the patient via MyChart unless otherwise noted below.  ? ? ?The patient was advised to call back or seek an in-person evaluation if the symptoms worsen or if the condition fails to improve as anticipated. ? ?Time:  ?I spent 12 minutes with the patient via telehealth technology discussing the above problems/concerns.   ? ?Gildardo Pounds, NP  ?

## 2022-03-06 NOTE — Patient Instructions (Signed)
?  Albesa Seen, thank you for joining Gildardo Pounds, NP for today's virtual visit.  While this provider is not your primary care provider (PCP), if your PCP is located in our provider database this encounter information will be shared with them immediately following your visit. ? ?Consent: ?(Patient) Earl Ortiz provided verbal consent for this virtual visit at the beginning of the encounter. ? ?Current Medications: ? ?Current Outpatient Medications:  ?  EPINEPHrine 0.3 mg/0.3 mL IJ SOAJ injection, Inject 0.3 mLs (0.3 mg total) into the muscle once., Disp: 2 Device, Rfl: 1 ?  febuxostat (ULORIC) 40 MG tablet, Take 1 tablet (40 mg total) by mouth daily. COURTESY REFILL for 30days, Disp: 30 tablet, Rfl: 0 ?  fexofenadine (ALLEGRA) 180 MG tablet, Take 1 tablet (180 mg total) by mouth daily., Disp: 180 tablet, Rfl: 0 ?  fluticasone (FLONASE) 50 MCG/ACT nasal spray, Place 2 sprays into both nostrils daily., Disp: 48 mL, Rfl: 1  ? ?Medications ordered in this encounter:  ?Meds ordered this encounter  ?Medications  ? febuxostat (ULORIC) 40 MG tablet  ?  Sig: Take 1 tablet (40 mg total) by mouth daily. COURTESY REFILL for 30days  ?  Dispense:  30 tablet  ?  Refill:  0  ?  Order Specific Question:   Supervising Provider  ?  Answer:   Noemi Chapel [[id number]]  ?  ? ?*If you need refills on other medications prior to your next appointment, please contact your pharmacy* ? ?Follow-Up: ?Call back or seek an in-person evaluation if the symptoms worsen or if the condition fails to improve as anticipated. ? ?Other Instructions ?Courtesy refill only ?Will need to establish with podiatry. Patient is aware that he is also overdue for blood work and this needs to be completed to check renal function with taking uloric.  ? ? ?If you have been instructed to have an in-person evaluation today at a local Urgent Care facility, please use the link below. It will take you to a list of all of our available [facility], including  address, phone number and hours of operation. Please do not delay care.  ?[facility] ? ?If you or a family member do not have a primary care provider, use the link below to schedule a visit and establish care. When you choose a [facility] primary care physician or advanced practice provider, you gain a long-term partner in health. ?Find a Primary Care Provider ? ?Learn more about [facility]'s in-office and virtual care options: ?[facility]  ?

## 2022-03-28 ENCOUNTER — Ambulatory Visit (HOSPITAL_BASED_OUTPATIENT_CLINIC_OR_DEPARTMENT_OTHER): Payer: Managed Care, Other (non HMO)

## 2022-04-06 ENCOUNTER — Ambulatory Visit (INDEPENDENT_AMBULATORY_CARE_PROVIDER_SITE_OTHER): Payer: Managed Care, Other (non HMO) | Admitting: Family Medicine

## 2022-04-06 ENCOUNTER — Encounter (HOSPITAL_BASED_OUTPATIENT_CLINIC_OR_DEPARTMENT_OTHER): Payer: Self-pay | Admitting: Family Medicine

## 2022-04-06 VITALS — BP 150/91 | HR 90 | Temp 97.7°F | Ht 76.0 in | Wt 311.8 lb

## 2022-04-06 DIAGNOSIS — Z0000 Encounter for general adult medical examination without abnormal findings: Secondary | ICD-10-CM

## 2022-04-06 DIAGNOSIS — M1A9XX1 Chronic gout, unspecified, with tophus (tophi): Secondary | ICD-10-CM

## 2022-04-06 DIAGNOSIS — R030 Elevated blood-pressure reading, without diagnosis of hypertension: Secondary | ICD-10-CM

## 2022-04-06 DIAGNOSIS — B079 Viral wart, unspecified: Secondary | ICD-10-CM | POA: Insufficient documentation

## 2022-04-06 MED ORDER — FEBUXOSTAT 40 MG PO TABS: 40.0000 mg | ORAL_TABLET | Freq: Every day | ORAL | 0 refills | Status: DC

## 2022-04-06 NOTE — Assessment & Plan Note (Signed)
Routine HCM labs ordered. HCM reviewed/discussed. Anticipatory guidance regarding healthy weight, lifestyle and choices given. ?Recommend healthy diet.  Recommend approximately 150 minutes/week of moderate intensity exercise ?Recommend regular dental and vision exams ?Always use seatbelt/lap and shoulder restraints ?Recommend using smoke alarms and checking batteries at least twice a year ?Recommend using sunscreen when outside ?UTD with tetanus ?

## 2022-04-06 NOTE — Progress Notes (Signed)
?Subjective:   ? ?CC: Annual Physical Exam ? ?HPI:  ?Emric Kowalewski is a 36 y.o. presenting for annual physical ? ?I reviewed the past medical history, family history, social history, surgical history, and allergies today and no changes were needed.  Please see the problem list section below in epic for further details. ? ?Past Medical History: ?Past Medical History:  ?Diagnosis Date  ? 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 Asthma   ? Family history of pancreatic cancer   ? Sleep apnea   ? Diagnosed by the [facility] this past winter  ? ?Past Surgical History: ?Past Surgical History:  ?Procedure Laterality Date  ? FRACTURE SURGERY    ? Left shoulder in 5th or 6th grade  ? left shoulder surgery    ? WISDOM TOOTH EXTRACTION    ? ?Social History: ?Social History  ? ?Socioeconomic History  ? Marital status: Married  ?  Spouse name: Not on file  ? Number of children: Not on file  ? Years of education: Not on file  ? Highest education level: Not on file  ?Occupational History  ? Not on file  ?Tobacco Use  ? Smoking status: Never  ? Smokeless tobacco: Never  ?Vaping Use  ? Vaping Use: Never used  ?Substance and Sexual Activity  ? Alcohol use: No  ? Drug use: No  ? Sexual activity: Yes  ?  Birth control/protection: I.U.D.  ?Other Topics Concern  ? Not on file  ?Social History Narrative  ? 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 ?Social Determinants of Health  ? ?Financial Resource Strain: Not on file  ?Food Insecurity: Not on file  ?Transportation Needs: Not on file  ?Physical Activity: Not on file  ?Stress: Not on file  ?Social Connections: Not on file  ? ?Family History: ?Family History  ?Problem Relation Age of Onset  ? Allergic rhinitis Brother   ? Depression Brother   ? Pancreatic cancer Mother 63  ?     PNET  ? Cancer Mother   ? Depression Mother   ? Miscarriages / [place] Mother   ? Pancreatic cancer Father 65  ?     Adenocarcioma  ? Cancer Father   ? Diabetes Maternal Uncle   ? Stroke Maternal Grandmother   ?  Diabetes Maternal Grandfather   ? Lung cancer Paternal Grandmother   ?     smoker  ? Healthy Son   ? Healthy Daughter   ? Asthma Neg Hx   ? Eczema Neg Hx   ? Urticaria Neg Hx   ? ?Allergies: ?Allergies  ?Allergen Reactions  ? Ethanol Anaphylaxis, Cough, Nausea Only and Rash  ? ?Medications: See med rec. ? ?Review of Systems: No headache, visual changes, nausea, vomiting, diarrhea, constipation, dizziness, abdominal pain, skin rash, fevers, chills, night sweats, swollen lymph nodes, weight loss, chest pain, body aches, joint swelling, muscle aches, shortness of breath, mood changes, visual or auditory hallucinations. ? ?Objective:   ? ?BP (!) 150/91   Pulse 90   Temp 97.7 ?F (36.5 ?C)   Ht '6\' 4"'$  (1.93 m)   Wt (!) 311 lb 12.8 oz (141.4 kg)   SpO2 99%   BMI 37.95 kg/m?  ? ?General: Well Developed, well nourished, and in no acute distress.  ?Neuro: Alert and oriented x3, extra-ocular muscles intact, sensation grossly intact. Cranial nerves II through XII are intact, motor, sensory, and coordinative functions are all intact. ?HEENT: Normocephalic, atraumatic, pupils equal round reactive to light, neck  supple, no masses, no lymphadenopathy, thyroid nonpalpable. Oropharynx, nasopharynx, external ear canals are unremarkable. ?Skin: Warm and dry, no rashes noted.  ?Cardiac: Regular rate and rhythm, no murmurs rubs or gallops.  ?Respiratory: Clear to auscultation bilaterally. Not using accessory muscles, speaking in full sentences.  ?Abdominal: Soft, nontender, nondistended, positive bowel sounds, no masses, no organomegaly.  ?Musculoskeletal: Shoulder, elbow, wrist, hip, knee, ankle stable, and with full range of motion. ? ?Impression and Recommendations:   ? ?Wellness examination ?Routine HCM labs ordered. HCM reviewed/discussed. Anticipatory guidance regarding healthy weight, lifestyle and choices given. ?Recommend healthy diet.  Recommend approximately 150 minutes/week of moderate intensity exercise ?Recommend  regular dental and vision exams ?Always use seatbelt/lap and shoulder restraints ?Recommend using smoke alarms and checking batteries at least twice a year ?Recommend using sunscreen when outside ?UTD with tetanus ? ?Elevated blood pressure reading without diagnosis of hypertension ?Patient with slightly elevated blood pressure in office today ?Recommend intermittent monitoring of blood pressure at home, DASH diet, regular aerobic exercise, working towards healthy, gradual weight loss ?We will plan for follow-up in about 4 months to reassess progress with blood pressure ? ?Requesting refill of Suboxone stat as his prior podiatry office closed and he will be establishing with new podiatrist next month.  Refill provided for patient until he is able to establish with new podiatrist ? ?Patient also reports history of some intermittent palpitations which predominantly occur at rest.  Denies any symptoms with exertion.  Has not had any chest pain or shortness of breath associated with these episodes.  Occasionally will feel slight lightheadedness, has not had any syncopal episodes.  This has been going on for a couple years, has not really noticed any worsening.  Discussed options today with patient, we will proceed with baseline labs as previously ordered, offered EKG in office today, patient declined.  Also discussed possibility of referral to cardiology for further evaluation, consider of Holter monitor, patient to consider ? ?Plan for follow-up in about 4 months to monitor blood pressure ? ? ?___________________________________________ ?Alona Danford de [place], MD, ABFM, CAQSM ?Primary Care and Sports Medicine ?[place] ?

## 2022-04-06 NOTE — Patient Instructions (Signed)
Preventive Care 38-39 Years Old, Male Preventive care refers to lifestyle choices and visits with your health care provider that can promote health and wellness. Preventive care visits are also called wellness exams. What can I expect for my preventive care visit? Counseling During your preventive care visit, your health care provider may ask about your: Medical history, including: Past medical problems. Family medical history. Current health, including: Emotional well-being. Home life and relationship well-being. Sexual activity. Lifestyle, including: Alcohol, nicotine or tobacco, and drug use. Access to firearms. Diet, exercise, and sleep habits. Safety issues such as seatbelt and bike helmet use. Sunscreen use. Work and work environment. Physical exam Your health care provider may check your: Height and weight. These may be used to calculate your BMI (body mass index). BMI is a measurement that tells if you are at a healthy weight. Waist circumference. This measures the distance around your waistline. This measurement also tells if you are at a healthy weight and may help predict your risk of certain diseases, such as type 2 diabetes and high blood pressure. Heart rate and blood pressure. Body temperature.  Vaccines are usually given at various ages, according to a schedule. Your health care provider will recommend vaccines for you based on your age, medical history, and lifestyle or other factors, such as travel or where you work. What tests do I need? Screening Your health care provider may recommend screening tests for certain conditions. This may include: Lipid and cholesterol levels. Diabetes screening. This is done by checking your blood sugar (glucose) after you have not eaten for a while (fasting). Hepatitis B test. Hepatitis C test. HIV (human immunodeficiency virus) test. STI (sexually transmitted infection) testing, if you are at risk. Talk with your health care  provider about your test results, treatment options, and if necessary, the need for more tests. Follow these instructions at home: Eating and drinking  Eat a healthy diet that includes fresh fruits and vegetables, whole grains, lean protein, and low-fat dairy products. Drink enough fluid to keep your urine pale yellow. Take vitamin and mineral supplements as recommended by your health care provider. Do not drink alcohol if your health care provider tells you not to drink. If you drink alcohol: Limit how much you have to 0-2 drinks a day. Know how much alcohol is in your drink. In the [place] one drink equals one 12 oz bottle of beer (355 mL), one 5 oz glass of wine (148 mL), or one 1 oz glass of hard liquor (44 mL). Lifestyle Brush your teeth every morning and night with fluoride toothpaste. Floss one time each day. Exercise for at least 30 minutes 5 or more days each week. Do not use any products that contain nicotine or tobacco. These products include cigarettes, chewing tobacco, and vaping devices, such as e-cigarettes. If you need help quitting, ask your health care provider. Do not use drugs. If you are sexually active, practice safe sex. Use a condom or other form of protection to prevent STIs. Find healthy ways to manage stress, such as: Meditation, yoga, or listening to music. Journaling. Talking to a trusted person. Spending time with friends and family. Minimize exposure to UV radiation to reduce your risk of skin cancer. Safety Always wear your seat belt while driving or riding in a vehicle. Do not drive: If you have been drinking alcohol. Do not ride with someone who has been drinking. If you have been using any mind-altering substances or drugs. While texting. When you are tired or   distracted. Wear a helmet and other protective equipment during sports activities. If you have firearms in your house, make sure you follow all gun safety procedures. Seek help if you have been  physically or sexually abused. What's next? Go to your health care provider once a year for an annual wellness visit. Ask your health care provider how often you should have your eyes and teeth checked. Stay up to date on all vaccines. This information is not intended to replace advice given to you by your health care provider. Make sure you discuss any questions you have with your health care provider. Document Revised: 05/26/2021 Document Reviewed: 05/26/2021 Elsevier Patient Education  2023 Elsevier Inc.  

## 2022-04-06 NOTE — Assessment & Plan Note (Signed)
Patient with slightly elevated blood pressure in office today ?Recommend intermittent monitoring of blood pressure at home, DASH diet, regular aerobic exercise, working towards healthy, gradual weight loss ?We will plan for follow-up in about 4 months to reassess progress with blood pressure ?

## 2022-04-11 ENCOUNTER — Ambulatory Visit (HOSPITAL_BASED_OUTPATIENT_CLINIC_OR_DEPARTMENT_OTHER): Payer: Managed Care, Other (non HMO)

## 2022-04-20 ENCOUNTER — Ambulatory Visit: Payer: Managed Care, Other (non HMO) | Admitting: Podiatry

## 2022-04-25 ENCOUNTER — Ambulatory Visit (INDEPENDENT_AMBULATORY_CARE_PROVIDER_SITE_OTHER): Payer: Managed Care, Other (non HMO)

## 2022-04-25 ENCOUNTER — Ambulatory Visit: Payer: Managed Care, Other (non HMO) | Admitting: Podiatry

## 2022-04-25 DIAGNOSIS — M79671 Pain in right foot: Secondary | ICD-10-CM | POA: Diagnosis not present

## 2022-04-25 DIAGNOSIS — M1A9XX1 Chronic gout, unspecified, with tophus (tophi): Secondary | ICD-10-CM | POA: Diagnosis not present

## 2022-04-25 MED ORDER — COLCHICINE 0.6 MG PO TABS: 0.6000 mg | ORAL_TABLET | Freq: Every day | ORAL | 0 refills | Status: DC

## 2022-04-25 MED ORDER — FEBUXOSTAT 40 MG PO TABS: 40.0000 mg | ORAL_TABLET | Freq: Every day | ORAL | 1 refills | Status: DC

## 2022-04-25 NOTE — Progress Notes (Signed)
? ?  HPI: 36 y.o. 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  Patient has had gout for several years intermittently.  He has been seen and managed in the past by Dr. Gershon Mussel, local podiatrist who is no longer in practice.  He presents today to establish care here.  Patient states that currently he takes Uloric 40 mg daily for the gout.  He has tried allopurinol in the past with no improvement.  He presents for further treatment and evaluation ? ?Past Medical History:  ?Diagnosis Date  ? 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 Asthma   ? Family history of pancreatic cancer   ? Sleep apnea   ? Diagnosed by the [facility] this past winter  ? ? ?Past Surgical History:  ?Procedure Laterality Date  ? FRACTURE SURGERY    ? Left shoulder in 5th or 6th grade  ? left shoulder surgery    ? WISDOM TOOTH EXTRACTION    ? ? ?Allergies  ?Allergen Reactions  ? Ethanol Anaphylaxis, Cough, Nausea Only and Rash  ? ?  ?Physical Exam: ?General: The patient is alert and oriented x3 in no acute distress. ? ?Dermatology: Skin is warm, dry and supple bilateral lower extremities. Negative for open lesions or macerations. ? ?Vascular: Palpable pedal pulses bilaterally. Capillary refill within normal limits.  Negative for any significant edema or erythema ? ?Neurological: Light touch and protective threshold grossly intact ? ?Musculoskeletal Exam: No pedal deformities noted.  There is some mild tenderness to palpation and range of motion of the first MTP right ? ?Radiographic Exam:  ?Normal osseous mineralization.  There is some subtle joint space narrowing with degenerative changes noted specifically to the first MTP joint of the right foot ? ?Assessment: ?1.  Chronic inflammatory intermittent gout RT foot ? ? ?Plan of Care:  ?1. Patient evaluated. X-Rays reviewed.  ?2.  Today we had a long discussion on gout and different treatment options. ?3.  Refill  prescription for Uloric 40 mg #90 daily with 1 refill.  I explained that I do recommend long-term medications to be managed by PCP for continuity and streamline care for the future ?4.  Prescription for colchicine 0.6 mg daily as needed inflammatory gout flareups ?5.  Continue to avoid foods rich in purines that would elicit gout ?6.  Return to clinic as needed ? ?  ?  ?Edrick Kins, DPM ?[place] ? ?Dr. Edrick Kins, DPM  ?  ?[address]                                        ?[place], [place] [address]                ?Office [phone]  ?Fax [phone] ? ? ? ? ?

## 2022-06-15 ENCOUNTER — Ambulatory Visit (INDEPENDENT_AMBULATORY_CARE_PROVIDER_SITE_OTHER): Payer: Managed Care, Other (non HMO) | Admitting: Family Medicine

## 2022-06-15 DIAGNOSIS — M1A9XX1 Chronic gout, unspecified, with tophus (tophi): Secondary | ICD-10-CM

## 2022-06-15 DIAGNOSIS — Z0000 Encounter for general adult medical examination without abnormal findings: Secondary | ICD-10-CM

## 2022-06-15 MED ORDER — FEBUXOSTAT 40 MG PO TABS: 40.0000 mg | ORAL_TABLET | Freq: Every day | ORAL | 1 refills | Status: DC

## 2022-06-15 NOTE — Progress Notes (Unsigned)
   Virtual Visit via Telephone   I connected with  Earl Ortiz  on 06/16/22 by telephone/telehealth and verified that I am speaking with the correct person using two identifiers.   I discussed the limitations, risks, security and privacy concerns of performing an evaluation and management service by telephone, including the higher likelihood of inaccurate diagnosis and treatment, and the availability of in person appointments.  We also discussed the likely need of an additional face to face encounter for complete and high quality delivery of care.  I also discussed with the patient that there may be a patient responsible charge related to this service. The patient expressed understanding and wishes to proceed.  Provider location is in medical facility. Patient location is at their home, different from provider location. People involved in care of the patient during this telehealth encounter were myself, my nurse/medical assistant, and my front office/scheduling team member.  Review of Systems: No fevers, chills, night sweats, weight loss, chest pain, or shortness of breath.   Objective Findings:    General: Speaking full sentences, no audible heavy breathing.  Sounds alert and appropriately interactive.    Independent interpretation of tests performed by another provider:   None.  Brief History, Exam, Impression, and Recommendations:    Gout Patient did meet with a podiatrist recently with discussion of general recommendations regarding gout management.  He was continued on Uloric at 40 mg dose.  He was also provided with colchicine to be used as needed in case of any gout attacks.  Reports being told that if he did have any increase in aches or discomfort thought to be related to gout, he could adjust dose of Uloric as needed.  He has been fluctuating between 40 and 80 mg of Uloric daily, is about out of current prescription and will not be able to pick up refill for a few weeks.  No recent  uric acid check.  Podiatrist did discuss having [place] prescribe Uloric for patient. We can continue with Uloric for patient, would need to be on regular maintenance dose of medication and determine appropriateness or if titration is needed.  Discussed with patient continuing with 40 mg daily of Uloric and assessing uric acid level in the next few weeks.  He is also due to have labs completed for recent physical If uric acid level not at goal with 40 mg dose, would increase to 80 mg dose and recheck uric acid level  I discussed the above assessment and treatment plan with the patient. The patient was provided an opportunity to ask questions and all were answered. The patient agreed with the plan and demonstrated an understanding of the instructions.  The patient was advised to call back or seek an in-person evaluation if the symptoms worsen or if the condition fails to improve as anticipated.  I provided 10 minutes of face to face and non-face-to-face time during this encounter date, time was needed to gather information, review chart, records, communicate/coordinate with staff remotely, as well as complete documentation.   ___________________________________________ Brehanna Deveny de [place], MD, ABFM, CAQSM Primary Care and [place]

## 2022-06-16 NOTE — Assessment & Plan Note (Signed)
Patient did meet with a podiatrist recently with discussion of general recommendations regarding gout management.  He was continued on Uloric at 40 mg dose.  He was also provided with colchicine to be used as needed in case of any gout attacks.  Reports being told that if he did have any increase in aches or discomfort thought to be related to gout, he could adjust dose of Uloric as needed.  He has been fluctuating between 40 and 80 mg of Uloric daily, is about out of current prescription and will not be able to pick up refill for a few weeks.  No recent uric acid check.  Podiatrist did discuss having [place] prescribe Uloric for patient. We can continue with Uloric for patient, would need to be on regular maintenance dose of medication and determine appropriateness or if titration is needed.  Discussed with patient continuing with 40 mg daily of Uloric and assessing uric acid level in the next few weeks.  He is also due to have labs completed for recent physical If uric acid level not at goal with 40 mg dose, would increase to 80 mg dose and recheck uric acid level

## 2022-06-29 ENCOUNTER — Ambulatory Visit (HOSPITAL_BASED_OUTPATIENT_CLINIC_OR_DEPARTMENT_OTHER): Payer: Managed Care, Other (non HMO)

## 2022-06-29 DIAGNOSIS — M1A9XX1 Chronic gout, unspecified, with tophus (tophi): Secondary | ICD-10-CM

## 2022-06-29 DIAGNOSIS — Z0000 Encounter for general adult medical examination without abnormal findings: Secondary | ICD-10-CM

## 2022-06-30 LAB — COMPREHENSIVE METABOLIC PANEL
ALT: 63 IU/L — ABNORMAL HIGH (ref 0–44)
AST: 29 IU/L (ref 0–40)
Albumin/Globulin Ratio: 2.2 (ref 1.2–2.2)
Albumin: 4.8 g/dL (ref 4.1–5.1)
Alkaline Phosphatase: 56 IU/L (ref 44–121)
BUN/Creatinine Ratio: 16 (ref 9–20)
BUN: 19 mg/dL (ref 6–20)
Bilirubin Total: 0.5 mg/dL (ref 0.0–1.2)
CO2: 22 mmol/L (ref 20–29)
Calcium: 9.8 mg/dL (ref 8.7–10.2)
Chloride: 104 mmol/L (ref 96–106)
Creatinine, Ser: 1.21 mg/dL (ref 0.76–1.27)
Globulin, Total: 2.2 g/dL (ref 1.5–4.5)
Glucose: 93 mg/dL (ref 70–99)
Potassium: 4.6 mmol/L (ref 3.5–5.2)
Sodium: 142 mmol/L (ref 134–144)
Total Protein: 7 g/dL (ref 6.0–8.5)
eGFR: 79 mL/min/{1.73_m2} (ref >59)

## 2022-06-30 LAB — CBC WITH DIFFERENTIAL/PLATELET
Basophils Absolute: 0 10*3/uL (ref 0.0–0.2)
Basos: 0 %
EOS (ABSOLUTE): 0.3 10*3/uL (ref 0.0–0.4)
Eos: 4 %
Hematocrit: 45.8 % (ref 37.5–51.0)
Hemoglobin: 15.4 g/dL (ref 13.0–17.7)
Immature Grans (Abs): 0 10*3/uL (ref 0.0–0.1)
Immature Granulocytes: 0 %
Lymphocytes Absolute: 2.7 10*3/uL (ref 0.7–3.1)
Lymphs: 35 %
MCH: 29.3 pg (ref 26.6–33.0)
MCHC: 33.6 g/dL (ref 31.5–35.7)
MCV: 87 fL (ref 79–97)
Monocytes Absolute: 0.5 10*3/uL (ref 0.1–0.9)
Monocytes: 7 %
Neutrophils Absolute: 4.2 10*3/uL (ref 1.4–7.0)
Neutrophils: 54 %
Platelets: 206 10*3/uL (ref 150–450)
RBC: 5.26 x10E6/uL (ref 4.14–5.80)
RDW: 12.6 % (ref 11.6–15.4)
WBC: 7.7 10*3/uL (ref 3.4–10.8)

## 2022-06-30 LAB — HEMOGLOBIN A1C
Est. average glucose Bld gHb Est-mCnc: 108 mg/dL
Hgb A1c MFr Bld: 5.4 % (ref 4.8–5.6)

## 2022-06-30 LAB — LIPID PANEL
Chol/HDL Ratio: 5.3 ratio — ABNORMAL HIGH (ref 0.0–5.0)
Cholesterol, Total: 222 mg/dL — ABNORMAL HIGH (ref 100–199)
HDL: 42 mg/dL (ref >39)
LDL Chol Calc (NIH): 143 mg/dL — ABNORMAL HIGH (ref 0–99)
Triglycerides: 203 mg/dL — ABNORMAL HIGH (ref 0–149)
VLDL Cholesterol Cal: 37 mg/dL (ref 5–40)

## 2022-06-30 LAB — URIC ACID: Uric Acid: 5.1 mg/dL (ref 3.8–8.4)

## 2022-06-30 LAB — TSH RFX ON ABNORMAL TO FREE T4: TSH: 0.71 u[IU]/mL (ref 0.450–4.500)

## 2022-08-05 ENCOUNTER — Ambulatory Visit (HOSPITAL_BASED_OUTPATIENT_CLINIC_OR_DEPARTMENT_OTHER): Payer: Self-pay | Admitting: Family Medicine

## 2022-10-12 DIAGNOSIS — S39012A Strain of muscle, fascia and tendon of lower back, initial encounter: Secondary | ICD-10-CM | POA: Insufficient documentation

## 2022-10-12 DIAGNOSIS — M533 Sacrococcygeal disorders, not elsewhere classified: Secondary | ICD-10-CM | POA: Insufficient documentation

## 2022-10-12 DIAGNOSIS — M47816 Spondylosis without myelopathy or radiculopathy, lumbar region: Secondary | ICD-10-CM | POA: Insufficient documentation

## 2022-10-12 DIAGNOSIS — M5416 Radiculopathy, lumbar region: Secondary | ICD-10-CM | POA: Insufficient documentation

## 2022-10-12 DIAGNOSIS — M545 Low back pain, unspecified: Secondary | ICD-10-CM | POA: Insufficient documentation

## 2023-01-14 ENCOUNTER — Other Ambulatory Visit (HOSPITAL_BASED_OUTPATIENT_CLINIC_OR_DEPARTMENT_OTHER): Payer: Self-pay | Admitting: Family Medicine

## 2023-01-14 DIAGNOSIS — M1A9XX1 Chronic gout, unspecified, with tophus (tophi): Secondary | ICD-10-CM

## 2023-02-15 ENCOUNTER — Encounter (HOSPITAL_BASED_OUTPATIENT_CLINIC_OR_DEPARTMENT_OTHER): Payer: Self-pay | Admitting: Family Medicine

## 2023-02-15 ENCOUNTER — Ambulatory Visit (HOSPITAL_BASED_OUTPATIENT_CLINIC_OR_DEPARTMENT_OTHER): Payer: Managed Care, Other (non HMO) | Admitting: Family Medicine

## 2023-02-15 ENCOUNTER — Other Ambulatory Visit: Payer: Self-pay

## 2023-02-15 VITALS — BP 154/102 | HR 107 | Temp 98.6°F | Ht 76.0 in | Wt 303.2 lb

## 2023-02-15 DIAGNOSIS — I10 Essential (primary) hypertension: Secondary | ICD-10-CM

## 2023-02-15 HISTORY — DX: Essential (primary) hypertension: I10

## 2023-02-15 MED ORDER — AMLODIPINE BESYLATE 5 MG PO TABS: 5.0000 mg | ORAL_TABLET | Freq: Every day | ORAL | 1 refills | Status: DC

## 2023-02-15 NOTE — Progress Notes (Signed)
    Procedures performed today:    EKG: Normal sinus rhythm, rate is borderline tachycardic. Normal QT interval, no T wave abnormality.  Independent interpretation of notes and tests performed by another provider:   None.  Brief History, Exam, Impression, and Recommendations:    BP (!) 154/102   Pulse (!) 107   Temp 98.6 F (37 C) (Oral)   Ht 6\' 4"  (1.93 m)   Wt (!) 303 lb 3.2 oz (137.5 kg)   SpO2 99%   BMI 36.91 kg/m   Primary hypertension Patient presents for evaluation of elevated blood pressure.  Patient has been having elevated blood pressure readings and symptomatically has been feeling generally poor.  No significant chest pain, no exertional symptoms.  Occasional headaches.  Does have family history. Blood pressure is elevated in office today.  We discussed treatment considerations.  EKG completed, results as above.  Will proceed with initial antihypertensive treatment utilizing low-dose amlodipine initially and monitor response to this. Recommend intermittent monitoring of blood pressure at home, DASH diet Will plan for close follow-up in about 3 weeks to assess progress with medication and if any further adjustments or changes are needed.  No follow-ups on file.   ___________________________________________ Shanti Agresti de [place], MD, ABFM, [facility] Primary Care and [place]

## 2023-03-09 ENCOUNTER — Ambulatory Visit (HOSPITAL_BASED_OUTPATIENT_CLINIC_OR_DEPARTMENT_OTHER): Payer: Managed Care, Other (non HMO) | Admitting: Family Medicine

## 2023-03-09 VITALS — BP 128/97 | HR 73 | Ht 76.0 in | Wt 307.0 lb

## 2023-03-09 DIAGNOSIS — I10 Essential (primary) hypertension: Secondary | ICD-10-CM

## 2023-03-09 MED ORDER — AMLODIPINE BESYLATE 5 MG PO TABS: 5.0000 mg | ORAL_TABLET | Freq: Every day | ORAL | 1 refills | Status: DC

## 2023-03-09 NOTE — Assessment & Plan Note (Signed)
Patient reports that he has been doing well with amlodipine, he reports he has had very slight swelling near his ankles, otherwise no reported side effects.  He does feel that he has been much improved since starting medication.  He has been checking his blood pressure at home and generally systolic readings will be in the 130s to 0000000 and diastolic in the 0000000.  He has had some more stressful life circumstances recently with traveling out of [place] to visit his mother who had been in the ICU.  His mother unfortunately will be placed into palliative care in the near future. We discussed options today, given improvement since initiating amlodipine, we will continue with medication at this time.  Very mild side effects at this time, would be reluctant to further increase dose of medication.  Given that he has been doing better with this and blood pressure is borderline now, can allow for patient to continue with lifestyle modifications to see if further improvement in blood pressure can occur with these adjustments. Recommend intermittent monitoring of blood pressure at home, DASH diet Plan for follow-up in about 2 months to assess progress, and determine need for further intervention which would likely entail addition of second antihypertensive agent

## 2023-03-09 NOTE — Progress Notes (Signed)
    Procedures performed today:    None.  Independent interpretation of notes and tests performed by another provider:   None.  Brief History, Exam, Impression, and Recommendations:    BP (!) 133/100 (BP Location: Right Arm, Patient Position: Sitting, Cuff Size: Large)   Pulse 73   Ht 6\' 4"  (1.93 m)   Wt (!) 307 lb (139.3 kg)   SpO2 99%   BMI 37.37 kg/m   Primary hypertension Patient reports that he has been doing well with amlodipine, he reports he has had very slight swelling near his ankles, otherwise no reported side effects.  He does feel that he has been much improved since starting medication.  He has been checking his blood pressure at home and generally systolic readings will be in the 130s to 0000000 and diastolic in the 0000000.  He has had some more stressful life circumstances recently with traveling out of [place] to visit his mother who had been in the ICU.  His mother unfortunately will be placed into palliative care in the near future. We discussed options today, given improvement since initiating amlodipine, we will continue with medication at this time.  Very mild side effects at this time, would be reluctant to further increase dose of medication.  Given that he has been doing better with this and blood pressure is borderline now, can allow for patient to continue with lifestyle modifications to see if further improvement in blood pressure can occur with these adjustments. Recommend intermittent monitoring of blood pressure at home, DASH diet Plan for follow-up in about 2 months to assess progress, and determine need for further intervention which would likely entail addition of second antihypertensive agent  Return in about 2 months (around 05/09/2023) for HTN.   ___________________________________________ Vikas Wegmann de [place], MD, ABFM, CAQSM Primary Care and [place]

## 2023-03-09 NOTE — Assessment & Plan Note (Signed)
Patient presents for evaluation of elevated blood pressure.  Patient has been having elevated blood pressure readings and symptomatically has been feeling generally poor.  No significant chest pain, no exertional symptoms.  Occasional headaches.  Does have family history. Blood pressure is elevated in office today.  We discussed treatment considerations.  EKG completed, results as above.  Will proceed with initial antihypertensive treatment utilizing low-dose amlodipine initially and monitor response to this. Recommend intermittent monitoring of blood pressure at home, DASH diet Will plan for close follow-up in about 3 weeks to assess progress with medication and if any further adjustments or changes are needed.

## 2023-05-09 ENCOUNTER — Ambulatory Visit (HOSPITAL_BASED_OUTPATIENT_CLINIC_OR_DEPARTMENT_OTHER): Payer: Managed Care, Other (non HMO) | Admitting: Family Medicine

## 2023-05-09 ENCOUNTER — Encounter (HOSPITAL_BASED_OUTPATIENT_CLINIC_OR_DEPARTMENT_OTHER): Payer: Self-pay | Admitting: Family Medicine

## 2023-05-09 VITALS — BP 141/95 | HR 88 | Ht 76.0 in | Wt 308.0 lb

## 2023-05-09 DIAGNOSIS — I10 Essential (primary) hypertension: Secondary | ICD-10-CM

## 2023-05-09 MED ORDER — VALSARTAN 80 MG PO TABS: 80.0000 mg | ORAL_TABLET | Freq: Every day | ORAL | 1 refills | Status: DC

## 2023-05-09 NOTE — Progress Notes (Signed)
    Procedures performed today:    None.  Independent interpretation of notes and tests performed by another provider:   None.  Brief History, Exam, Impression, and Recommendations:    BP (!) 141/95 (BP Location: Right Arm, Patient Position: Sitting, Cuff Size: Large)   Pulse 88   Ht 6\' 4"  (1.93 m)   Wt (!) 308 lb (139.7 kg)   SpO2 100%   BMI 37.49 kg/m   Primary hypertension Blood pressure is slightly elevated in office today.  Patient continues with amlodipine as prescribed.  He has been checking blood pressure intermittently at home and readings tend to be similar to that observed in office today.  Denies any chest pain or headaches, no lightheadedness or dizziness.  He has occasionally had fatigue and feels that this will tend to occur when blood pressure is running higher.  He feels that he has had slight swelling in bilateral lower extremities, however has not been significant issue for patient. We discussed options today including adjusting dose of current antihypertensive, addition of second agent.  Patient would prefer to try second medication at low-dose in conjunction with amlodipine at current dose which is reasonable.  We can proceed with initiation of valsartan and monitor response to this.  Advised on return to the office for labs in order to check electrolytes and kidney function with starting this new medication Recommend intermittent monitoring of blood pressure at home, continuing with lifestyle modifications in addition to medications as above We will plan for follow-up in the office in about 4 to 6 weeks to monitor progress with new medication or sooner as needed  Return in about 4 weeks (around 06/06/2023) for HTN.   ___________________________________________ Earl Markovic de [place], MD, ABFM, [facility]

## 2023-05-09 NOTE — Patient Instructions (Addendum)

## 2023-05-17 NOTE — Assessment & Plan Note (Signed)
Blood pressure is slightly elevated in office today.  Patient continues with amlodipine as prescribed.  He has been checking blood pressure intermittently at home and readings tend to be similar to that observed in office today.  Denies any chest pain or headaches, no lightheadedness or dizziness.  He has occasionally had fatigue and feels that this will tend to occur when blood pressure is running higher.  He feels that he has had slight swelling in bilateral lower extremities, however has not been significant issue for patient. We discussed options today including adjusting dose of current antihypertensive, addition of second agent.  Patient would prefer to try second medication at low-dose in conjunction with amlodipine at current dose which is reasonable.  We can proceed with initiation of valsartan and monitor response to this.  Advised on return to the office for labs in order to check electrolytes and kidney function with starting this new medication Recommend intermittent monitoring of blood pressure at home, continuing with lifestyle modifications in addition to medications as above We will plan for follow-up in the office in about 4 to 6 weeks to monitor progress with new medication or sooner as needed

## 2023-05-31 ENCOUNTER — Other Ambulatory Visit (HOSPITAL_BASED_OUTPATIENT_CLINIC_OR_DEPARTMENT_OTHER): Payer: Self-pay | Admitting: Family Medicine

## 2023-05-31 ENCOUNTER — Other Ambulatory Visit (HOSPITAL_BASED_OUTPATIENT_CLINIC_OR_DEPARTMENT_OTHER): Payer: Managed Care, Other (non HMO)

## 2023-06-06 ENCOUNTER — Ambulatory Visit (HOSPITAL_BASED_OUTPATIENT_CLINIC_OR_DEPARTMENT_OTHER): Payer: Managed Care, Other (non HMO) | Admitting: Family Medicine

## 2023-07-06 ENCOUNTER — Encounter (HOSPITAL_BASED_OUTPATIENT_CLINIC_OR_DEPARTMENT_OTHER): Payer: Self-pay | Admitting: Family Medicine

## 2023-07-06 DIAGNOSIS — M1A9XX1 Chronic gout, unspecified, with tophus (tophi): Secondary | ICD-10-CM

## 2023-07-25 MED ORDER — FEBUXOSTAT 40 MG PO TABS: 40.0000 mg | ORAL_TABLET | Freq: Every day | ORAL | 1 refills | Status: DC

## 2023-07-25 NOTE — Addendum Note (Signed)
Addended by: Wyvonne Lenz on: 07/25/2023 09:47 AM   Modules accepted: Orders

## 2023-08-01 ENCOUNTER — Other Ambulatory Visit (HOSPITAL_BASED_OUTPATIENT_CLINIC_OR_DEPARTMENT_OTHER): Payer: Managed Care, Other (non HMO)

## 2023-08-01 LAB — BASIC METABOLIC PANEL
CO2: 25 mmol/L (ref 20–29)
eGFR: 80 mL/min/{1.73_m2} (ref >59)

## 2023-08-10 ENCOUNTER — Ambulatory Visit (HOSPITAL_BASED_OUTPATIENT_CLINIC_OR_DEPARTMENT_OTHER): Payer: Managed Care, Other (non HMO) | Admitting: Family Medicine

## 2023-08-10 ENCOUNTER — Other Ambulatory Visit (HOSPITAL_BASED_OUTPATIENT_CLINIC_OR_DEPARTMENT_OTHER): Payer: Self-pay

## 2023-08-10 ENCOUNTER — Encounter (HOSPITAL_BASED_OUTPATIENT_CLINIC_OR_DEPARTMENT_OTHER): Payer: Self-pay | Admitting: Family Medicine

## 2023-08-10 VITALS — BP 139/101 | HR 85 | Ht 76.0 in | Wt 307.4 lb

## 2023-08-10 DIAGNOSIS — Z23 Encounter for immunization: Secondary | ICD-10-CM | POA: Diagnosis not present

## 2023-08-10 DIAGNOSIS — E669 Obesity, unspecified: Secondary | ICD-10-CM

## 2023-08-10 DIAGNOSIS — I10 Essential (primary) hypertension: Secondary | ICD-10-CM | POA: Diagnosis not present

## 2023-08-10 MED ORDER — COLCHICINE 0.6 MG PO TABS
0.6000 mg | ORAL_TABLET | Freq: Every day | ORAL | 0 refills | Status: DC
  Filled 2023-08-10: qty 30, 30d supply, fill #0

## 2023-08-10 MED ORDER — ZEPBOUND 2.5 MG/0.5ML ~~LOC~~ SOAJ
2.5000 mg | SUBCUTANEOUS | 1 refills | Status: DC
  Filled 2023-08-10: qty 2, 28d supply, fill #0

## 2023-08-10 NOTE — Assessment & Plan Note (Signed)
Blood pressure does show some improvement from prior office visit readings.  He does check blood pressure occasionally at home, feels that blood pressure has been generally stable at home.  He does note that symptoms have been improved, primarily citing improvement in headaches.  He does continue with medications as prescribed, amlodipine 5 mg and valsartan 80 mg.  Recent labs after initiating ARB were normal, stable kidney function, normal electrolytes. We discussed options today, given gradual improvement in blood pressure readings and that they are near goal at this time, would be reasonable to continue with medications as prescribed and continue to work on lifestyle modifications including DASH diet, gradual weight loss, regular physical activity.  Additional consideration is for slight adjustment in dose of ARB with increase of valsartan to 160 mg daily.  After discussion, patient elected proceed with continuing medications as they are and focusing on lifestyle modifications which is reasonable. Recommend intermittent monitoring of blood pressure at home, lifestyle interventions as above

## 2023-08-10 NOTE — Progress Notes (Addendum)
    Procedures performed today:    None.  Independent interpretation of notes and tests performed by another provider:   None.  Brief History, Exam, Impression, and Recommendations:    BP (!) 139/101 Comment: bp recheck  Pulse 85   Ht 6\' 4"  (1.93 m)   Wt (!) 307 lb 6.4 oz (139.4 kg)   SpO2 100%   BMI 37.42 kg/m   Immunization due -     Flu vaccine trivalent PF, 6mos and older(Flulaval,Afluria,Fluarix,Fluzone)  Primary hypertension Assessment & Plan: Blood pressure does show some improvement from prior office visit readings.  He does check blood pressure occasionally at home, feels that blood pressure has been generally stable at home.  He does note that symptoms have been improved, primarily citing improvement in headaches.  He does continue with medications as prescribed, amlodipine 5 mg and valsartan 80 mg.  Recent labs after initiating ARB were normal, stable kidney function, normal electrolytes. We discussed options today, given gradual improvement in blood pressure readings and that they are near goal at this time, would be reasonable to continue with medications as prescribed and continue to work on lifestyle modifications including DASH diet, gradual weight loss, regular physical activity.  Additional consideration is for slight adjustment in dose of ARB with increase of valsartan to 160 mg daily.  After discussion, patient elected proceed with continuing medications as they are and focusing on lifestyle modifications which is reasonable. Recommend intermittent monitoring of blood pressure at home, lifestyle interventions as above  Orders: -     Zepbound; Inject 2.5 mg into the skin once a week.  Dispense: 2 mL; Refill: 1  Obesity (BMI 30-39.9) Assessment & Plan: Patient with concerns regarding weight. He has been focusing on lifestyle modifications, however has not noted significant change in weight. Patient has been working on lifestyle modifications - changes to diet and  exercise - for more than 3 months. Long discussion today reviewing weight loss medications including injectable options including GLP-1 receptor agonist, oral agents including Contrave, orlistat, phentermine.  We discussed potential risk, benefits, cost associated with these various medications as well as the possibility of insurance coverage or lack thereof.  We discussed typical dosing regimen for both injectable and oral medications, proper administration of each.  We discussed potential outcomes with each. After discussion of potential risks and adverse reactions with these medications and potential benefits of each, patient would like to proceed with injectable GLP-1 receptor agonist if possible.  Prescription sent to pharmacy on file, if medication is cost prohibitive for patient, he will let us know and we can look to send an alternative option.  Will plan for close follow-up to monitor response to whichever medication patient is able to initiate. Additional consideration is for referral to healthy weight and wellness clinic  Orders: -     Zepbound; Inject 2.5 mg into the skin once a week.  Dispense: 2 mL; Refill: 1  Other orders -     Colchicine; Take 1 tablet (0.6 mg total) by mouth daily. Take 2 tablets (1.2 mg total) by mouth at the first onset of pain and then 1 tablet daily.  Dispense: 30 tablet; Refill: 0  Return if symptoms worsen or fail to improve.  Spent 32 minutes on this patient encounter, including preparation, chart review, face-to-face counseling with patient and coordination of care, and documentation of encounter   ___________________________________________ Margeret Stachnik de [place], MD, ABFM, [facility]

## 2023-08-10 NOTE — Assessment & Plan Note (Addendum)
Patient with concerns regarding weight. He has been focusing on lifestyle modifications, however has not noted significant change in weight. Patient has been working on lifestyle modifications - changes to diet and exercise - for more than 3 months. Long discussion today reviewing weight loss medications including injectable options including GLP-1 receptor agonist, oral agents including Contrave, orlistat, phentermine.  We discussed potential risk, benefits, cost associated with these various medications as well as the possibility of insurance coverage or lack thereof.  We discussed typical dosing regimen for both injectable and oral medications, proper administration of each.  We discussed potential outcomes with each. After discussion of potential risks and adverse reactions with these medications and potential benefits of each, patient would like to proceed with injectable GLP-1 receptor agonist if possible.  Prescription sent to pharmacy on file, if medication is cost prohibitive for patient, he will let us know and we can look to send an alternative option.  Will plan for close follow-up to monitor response to whichever medication patient is able to initiate. Additional consideration is for referral to healthy weight and wellness clinic

## 2023-08-16 ENCOUNTER — Other Ambulatory Visit (HOSPITAL_BASED_OUTPATIENT_CLINIC_OR_DEPARTMENT_OTHER): Payer: Self-pay

## 2023-08-21 ENCOUNTER — Other Ambulatory Visit (HOSPITAL_BASED_OUTPATIENT_CLINIC_OR_DEPARTMENT_OTHER): Payer: Self-pay

## 2023-08-23 ENCOUNTER — Other Ambulatory Visit (HOSPITAL_BASED_OUTPATIENT_CLINIC_OR_DEPARTMENT_OTHER): Payer: Self-pay

## 2023-08-30 ENCOUNTER — Encounter (HOSPITAL_BASED_OUTPATIENT_CLINIC_OR_DEPARTMENT_OTHER): Payer: Self-pay | Admitting: Family Medicine

## 2023-09-08 ENCOUNTER — Other Ambulatory Visit (HOSPITAL_BASED_OUTPATIENT_CLINIC_OR_DEPARTMENT_OTHER): Payer: Self-pay

## 2023-09-14 ENCOUNTER — Other Ambulatory Visit (HOSPITAL_BASED_OUTPATIENT_CLINIC_OR_DEPARTMENT_OTHER): Payer: Self-pay | Admitting: Family Medicine

## 2023-09-22 ENCOUNTER — Other Ambulatory Visit (HOSPITAL_BASED_OUTPATIENT_CLINIC_OR_DEPARTMENT_OTHER): Payer: Self-pay

## 2023-09-22 MED ORDER — LORAZEPAM 1 MG PO TABS
ORAL_TABLET | ORAL | 0 refills | Status: DC
Start: 2023-09-22 — End: 2024-10-14
  Filled 2023-09-22: qty 2, 1d supply, fill #0

## 2023-09-22 MED ORDER — GABAPENTIN 300 MG PO CAPS
300.0000 mg | ORAL_CAPSULE | Freq: Two times a day (BID) | ORAL | 0 refills | Status: DC
  Filled 2023-09-22: qty 60, 30d supply, fill #0

## 2023-09-24 ENCOUNTER — Other Ambulatory Visit (HOSPITAL_BASED_OUTPATIENT_CLINIC_OR_DEPARTMENT_OTHER): Payer: Self-pay

## 2023-09-25 ENCOUNTER — Other Ambulatory Visit (HOSPITAL_BASED_OUTPATIENT_CLINIC_OR_DEPARTMENT_OTHER): Payer: Self-pay

## 2023-10-20 ENCOUNTER — Other Ambulatory Visit (HOSPITAL_BASED_OUTPATIENT_CLINIC_OR_DEPARTMENT_OTHER): Payer: Self-pay

## 2023-10-20 MED ORDER — CELECOXIB 200 MG PO CAPS
200.0000 mg | ORAL_CAPSULE | Freq: Two times a day (BID) | ORAL | 1 refills | Status: DC
  Filled 2023-10-20: qty 60, 30d supply, fill #0
  Filled 2023-11-19 – 2024-01-29 (×2): qty 60, 30d supply, fill #1
  Filled 2024-02-25 – 2024-03-11 (×2): qty 60, 30d supply, fill #2

## 2023-10-24 ENCOUNTER — Other Ambulatory Visit (HOSPITAL_BASED_OUTPATIENT_CLINIC_OR_DEPARTMENT_OTHER): Payer: Self-pay

## 2023-10-24 MED ORDER — GABAPENTIN 300 MG PO CAPS
300.0000 mg | ORAL_CAPSULE | Freq: Two times a day (BID) | ORAL | 0 refills | Status: DC
  Filled 2023-10-24: qty 60, 30d supply, fill #0

## 2023-11-19 ENCOUNTER — Other Ambulatory Visit (HOSPITAL_COMMUNITY): Payer: Self-pay

## 2023-11-20 ENCOUNTER — Other Ambulatory Visit: Payer: Self-pay

## 2023-11-20 ENCOUNTER — Other Ambulatory Visit (HOSPITAL_COMMUNITY): Payer: Self-pay

## 2023-11-21 ENCOUNTER — Other Ambulatory Visit (HOSPITAL_COMMUNITY): Payer: Self-pay

## 2023-11-21 MED ORDER — GABAPENTIN 300 MG PO CAPS
300.0000 mg | ORAL_CAPSULE | Freq: Two times a day (BID) | ORAL | 0 refills | Status: DC
  Filled 2023-11-21 (×2): qty 60, 30d supply, fill #0

## 2023-11-22 ENCOUNTER — Other Ambulatory Visit (HOSPITAL_BASED_OUTPATIENT_CLINIC_OR_DEPARTMENT_OTHER): Payer: Self-pay | Admitting: Family Medicine

## 2023-11-23 ENCOUNTER — Other Ambulatory Visit: Payer: Self-pay

## 2023-12-19 ENCOUNTER — Other Ambulatory Visit (HOSPITAL_COMMUNITY): Payer: Self-pay

## 2023-12-26 ENCOUNTER — Other Ambulatory Visit (HOSPITAL_COMMUNITY): Payer: Self-pay

## 2023-12-26 MED ORDER — GABAPENTIN 300 MG PO CAPS
300.0000 mg | ORAL_CAPSULE | Freq: Two times a day (BID) | ORAL | 0 refills | Status: DC
  Filled 2023-12-26 – 2024-01-15 (×2): qty 60, 30d supply, fill #0

## 2024-01-03 ENCOUNTER — Emergency Department (HOSPITAL_COMMUNITY)
Admission: EM | Admit: 2024-01-03 | Discharge: 2024-01-03 | Disposition: A | Discharge status: Home / Self Care | Payer: Managed Care, Other (non HMO) | Attending: Emergency Medicine | Admitting: Emergency Medicine

## 2024-01-03 ENCOUNTER — Emergency Department (HOSPITAL_COMMUNITY): Payer: Managed Care, Other (non HMO)

## 2024-01-03 ENCOUNTER — Other Ambulatory Visit: Payer: Self-pay

## 2024-01-03 DIAGNOSIS — J45909 Unspecified asthma, uncomplicated: Secondary | ICD-10-CM | POA: Insufficient documentation

## 2024-01-03 DIAGNOSIS — Z7951 Long term (current) use of inhaled steroids: Secondary | ICD-10-CM | POA: Insufficient documentation

## 2024-01-03 DIAGNOSIS — I10 Essential (primary) hypertension: Secondary | ICD-10-CM | POA: Insufficient documentation

## 2024-01-03 DIAGNOSIS — R079 Chest pain, unspecified: Secondary | ICD-10-CM | POA: Insufficient documentation

## 2024-01-03 DIAGNOSIS — Z79899 Other long term (current) drug therapy: Secondary | ICD-10-CM | POA: Insufficient documentation

## 2024-01-03 LAB — BASIC METABOLIC PANEL
Anion gap: 7 (ref 5–15)
BUN: 22 mg/dL — ABNORMAL HIGH (ref 6–20)
CO2: 21 mmol/L — ABNORMAL LOW (ref 22–32)
Calcium: 9.4 mg/dL (ref 8.9–10.3)
Chloride: 110 mmol/L (ref 98–111)
Creatinine, Ser: 1.19 mg/dL (ref 0.61–1.24)
GFR, Estimated: >60 mL/min (ref >60)
Glucose, Bld: 104 mg/dL — ABNORMAL HIGH (ref 70–99)
Potassium: 4.1 mmol/L (ref 3.5–5.1)
Sodium: 138 mmol/L (ref 135–145)

## 2024-01-03 LAB — CBC
HCT: 45.1 % (ref 39.0–52.0)
Hemoglobin: 15.1 g/dL (ref 13.0–17.0)
MCH: 29.1 pg (ref 26.0–34.0)
MCHC: 33.5 g/dL (ref 30.0–36.0)
MCV: 86.9 fL (ref 80.0–100.0)
Platelets: 224 10*3/uL (ref 150–400)
RBC: 5.19 MIL/uL (ref 4.22–5.81)
RDW: 11.9 % (ref 11.5–15.5)
WBC: 8.8 10*3/uL (ref 4.0–10.5)
nRBC: 0 % (ref 0.0–0.2)

## 2024-01-03 LAB — TROPONIN I (HIGH SENSITIVITY)
Troponin I (High Sensitivity): 3 ng/L (ref <18)
Troponin I (High Sensitivity): 3 ng/L (ref <18)

## 2024-01-03 MED ORDER — ALUM & MAG HYDROXIDE-SIMETH 200-200-20 MG/5ML PO SUSP
30.0000 mL | Freq: Once | ORAL | Status: AC
  Administered 2024-01-03: 30 mL via ORAL
  Filled 2024-01-03: qty 30

## 2024-01-03 MED ORDER — IOHEXOL 350 MG/ML SOLN
75.0000 mL | Freq: Once | INTRAVENOUS | Status: AC | PRN
  Administered 2024-01-03: 75 mL via INTRAVENOUS

## 2024-01-03 MED ORDER — LORAZEPAM 2 MG/ML IJ SOLN
0.5000 mg | Freq: Once | INTRAMUSCULAR | Status: DC
  Filled 2024-01-03: qty 1

## 2024-01-03 NOTE — ED Notes (Signed)
Patient transported to CT 

## 2024-01-03 NOTE — ED Provider Notes (Signed)
[place] EMERGENCY DEPARTMENT AT [facility] Provider Note   CSN: [id number] Arrival date & time: 01/03/24  1349     History  Chief Complaint  Patient presents with   Chest Pain    Earl Ortiz is a 38 y.o. male with PMHx HTN, asthma, who presents to the ED concern for central chest pain radiating to the back.  The symptoms started while patient was lifting a heavy box at physical therapy earlier today.  Pain was initially only associated with his back, but shortly went to his chest and also endorsed acid reflux with the symptoms.  Acid reflux resolved with GI cocktail provided by triage provider.  Other symptoms still present.  Patient initially went to Ortho urgent care who referred patient to ED.  Denies recent surgery/immobilization, hx DVT/PE, hemoptysis, hx cancer in the past 6 months, calf swelling/tenderness.    Chest Pain      Home Medications Prior to Admission medications   Medication Sig Start Date End Date Taking? Authorizing Provider  amLODipine (NORVASC) 5 MG tablet TAKE 1 TABLET (5 MG TOTAL) BY MOUTH DAILY. 09/14/23   de [place], Raymond J, MD  Celecoxib (CELEBREX PO) Take by mouth.    [provider]  celecoxib (CELEBREX) 200 MG capsule Take 1 capsule (200 mg total) by mouth 2 (two) times daily. 10/20/23     colchicine 0.6 MG tablet Take 1 tablet (0.6 mg total) by mouth daily. Take 2 tablets (1.2 mg total) by mouth at the first onset of pain and then 1 tablet daily. 08/10/23   de [place], Buren Kos, MD  EPINEPHrine 0.3 mg/0.3 mL IJ SOAJ injection Inject 0.3 mLs (0.3 mg total) into the muscle once. 02/08/16   [name], Heywood Iles, MD  febuxostat (ULORIC) 40 MG tablet Take 1 tablet (40 mg total) by mouth daily. 07/25/23   de [place], Buren Kos, MD  fexofenadine (ALLEGRA) 180 MG tablet Take 1 tablet (180 mg total) by mouth daily. 02/25/22 08/24/22  Theadora Rama Scales, PA-C  fluticasone (FLONASE) 50 MCG/ACT nasal spray Place 2 sprays into both nostrils daily.  02/25/22   Theadora Rama Scales, PA-C  gabapentin (NEURONTIN) 300 MG capsule Take 1 capsule (300 mg total) by mouth 2 (two) times daily. 12/26/23     LORazepam (ATIVAN) 1 MG tablet MUST HAVE DRIVER. Arrive 1 hour prior to your MRI appt. Take 1 tablet by mouth once your driver has been confirmed. If needed take a second tablet 30 minutes prior to MRI. 09/22/23     tirzepatide (ZEPBOUND) 2.5 MG/0.5ML Pen Inject 2.5 mg into the skin once a week. 08/10/23   de [place], Raymond J, MD  valsartan (DIOVAN) 80 MG tablet TAKE 1 TABLET BY MOUTH EVERY DAY 11/22/23   de [place], Buren Kos, MD      Allergies    Ethanol    Review of Systems   Review of Systems  Cardiovascular:  Positive for chest pain.    Physical Exam Updated Vital Signs BP 109/80 (BP Location: Left Arm)   Pulse 80   Temp 98.1 F (36.7 C) (Oral)   Resp 16   Ht 6\' 4"  (1.93 m)   Wt 136.1 kg   SpO2 98%   BMI 36.52 kg/m  Physical Exam Vitals and nursing note reviewed.  Constitutional:      General: He is not in acute distress.    Appearance: He is not ill-appearing or toxic-appearing.  HENT:     Head: Normocephalic and atraumatic.  Mouth/Throat:     Mouth: Mucous membranes are moist.     Pharynx: No oropharyngeal exudate or posterior oropharyngeal erythema.  Eyes:     General: No scleral icterus.       Right eye: No discharge.        Left eye: No discharge.     Conjunctiva/sclera: Conjunctivae normal.  Cardiovascular:     Rate and Rhythm: Normal rate and regular rhythm.     Pulses: Normal pulses.     Heart sounds: Normal heart sounds. No murmur heard. Pulmonary:     Effort: Pulmonary effort is normal. No respiratory distress.     Breath sounds: Normal breath sounds. No wheezing, rhonchi or rales.  Chest:     Chest wall: No tenderness.  Abdominal:     General: Bowel sounds are normal.     Palpations: Abdomen is soft. There is no mass.     Tenderness: There is no abdominal tenderness.  Musculoskeletal:     Right lower  leg: No edema.     Left lower leg: No edema.     Comments: No calf tenderness to palpation.  Skin:    General: Skin is warm and dry.     Coloration: Skin is not cyanotic.     Findings: No rash.  Neurological:     General: No focal deficit present.     Mental Status: He is alert and oriented to person, place, and time. Mental status is at baseline.  Psychiatric:        Mood and Affect: Mood normal.        Behavior: Behavior normal.     ED Results / Procedures / Treatments   Labs (all labs ordered are listed, but only abnormal results are displayed) Labs Reviewed  BASIC METABOLIC PANEL - Abnormal; Notable for the following components:      Result Value   CO2 21 (*)    Glucose, Bld 104 (*)    BUN 22 (*)    All other components within normal limits  CBC  TROPONIN I (HIGH SENSITIVITY)  TROPONIN I (HIGH SENSITIVITY)    EKG EKG Interpretation Date/Time:  Wednesday January 03 2024 13:59:21 EST Ventricular Rate:  100 PR Interval:  144 QRS Duration:  102 QT Interval:  338 QTC Calculation: 436 R Axis:   54  Text Interpretation: Normal sinus rhythm Cannot rule out Anterior infarct , age undetermined Abnormal ECG  \ Confirmed by Alvino Blood ([id number]) on 01/03/2024 3:32:24 PM  Radiology CT Angio Chest/Abd/Pel for Dissection W and/or W/WO Result Date: 01/03/2024 CLINICAL DATA:  Chest pain radiating to back EXAM: CT ANGIOGRAPHY CHEST, ABDOMEN AND PELVIS TECHNIQUE: Non-contrast CT of the chest was initially obtained. Multidetector CT imaging through the chest, abdomen and pelvis was performed using the standard protocol during bolus administration of intravenous contrast. Multiplanar reconstructed images and MIPs were obtained and reviewed to evaluate the vascular anatomy. RADIATION DOSE REDUCTION: This exam was performed according to the departmental dose-optimization program which includes automated exposure control, adjustment of the mA and/or kV according to patient size and/or use  of iterative reconstruction technique. CONTRAST:  75mL OMNIPAQUE IOHEXOL 350 MG/ML SOLN COMPARISON:  01/03/2024 FINDINGS: CTA CHEST FINDINGS Cardiovascular: No evidence of thoracic aortic aneurysm or dissection. The heart is unremarkable without pericardial effusion. There is technically adequate opacification of the pulmonary vasculature. No filling defects or pulmonary emboli. Mediastinum/Nodes: No enlarged mediastinal, hilar, or axillary lymph nodes. Thyroid gland is heterogeneous and enlarged, most consistent with goiter. Trachea and esophagus  are unremarkable. Lungs/Pleura: No acute airspace disease, effusion, or pneumothorax. Central airways are patent. Musculoskeletal: No acute or destructive bony abnormalities. Reconstructed images demonstrate no additional findings. Review of the MIP images confirms the above findings. CTA ABDOMEN AND PELVIS FINDINGS VASCULAR Aorta: Normal caliber aorta without aneurysm, dissection, vasculitis or significant stenosis. Celiac: Patent without evidence of aneurysm, dissection, vasculitis or significant stenosis. SMA: Patent without evidence of aneurysm, dissection, vasculitis or significant stenosis. Renals: There are 2 accessory right renal arteries supplying the lower pole right kidney, in addition to the main right renal artery. There is a single left renal artery. The bilateral renal arteries are patent without evidence of aneurysm, dissection, vasculitis, fibromuscular dysplasia or significant stenosis. IMA: Patent without evidence of aneurysm, dissection, vasculitis or significant stenosis. Inflow: Patent without evidence of aneurysm, dissection, vasculitis or significant stenosis. Veins: No obvious venous abnormality within the limitations of this arterial phase study. Review of the MIP images confirms the above findings. NON-VASCULAR Hepatobiliary: Hepatic steatosis. No focal liver abnormality. The gallbladder is unremarkable. Pancreas: Unremarkable. No pancreatic  ductal dilatation or surrounding inflammatory changes. Spleen: Normal in size without focal abnormality. Adrenals/Urinary Tract: Adrenal glands are unremarkable. Kidneys are normal, without renal calculi, focal lesion, or hydronephrosis. Bladder is unremarkable. Stomach/Bowel: No bowel obstruction or ileus. Normal appendix right lower quadrant. No bowel wall thickening or inflammatory change. Lymphatic: No pathologic adenopathy within the abdomen or pelvis. Reproductive: Prostate is unremarkable. Other: No free fluid or free intraperitoneal gas. Small fat containing umbilical hernia. No bowel herniation. Musculoskeletal: No acute or destructive bony abnormalities. Reconstructed images demonstrate no additional findings. Review of the MIP images confirms the above findings. IMPRESSION: Vascular: 1. No evidence of thoracoabdominal aortic aneurysm or dissection. 2. No evidence of pulmonary embolus. Nonvascular: 1. No acute intrathoracic, intra-abdominal, or intrapelvic process. 2. Hepatic steatosis. 3. Incidental heterogeneous and enlarged thyroid. Recommend non-emergent outpatient thyroid ultrasound if not previously evaluated. Reference: J Am Coll Radiol. 2015 Feb;12(2): 143-50 Electronically Signed   By: Sharlet Salina M.D.   On: 01/03/2024 19:32   DG Chest 2 View Result Date: 01/03/2024 CLINICAL DATA:  Chest pain. EXAM: CHEST - 2 VIEW COMPARISON:  None Available. FINDINGS: The heart size and mediastinal contours are within normal limits. Both lungs are clear. The visualized skeletal structures are unremarkable. IMPRESSION: No active cardiopulmonary disease. Electronically Signed   By: Obie Dredge M.D.   On: 01/03/2024 15:13    Procedures Procedures    Medications Ordered in ED Medications  LORazepam (ATIVAN) injection 0.5 mg (0.5 mg Intravenous Patient Refused/Not Given 01/03/24 1722)  alum & mag hydroxide-simeth (MAALOX/MYLANTA) 200-200-20 MG/5ML suspension 30 mL (30 mLs Oral Given 01/03/24 1436)   iohexol (OMNIPAQUE) 350 MG/ML injection 75 mL (75 mLs Intravenous Contrast Given 01/03/24 1911)    ED Course/ Medical Decision Making/ A&P                                 Medical Decision Making Amount and/or Complexity of Data Reviewed Labs: ordered. Radiology: ordered.  Risk Prescription drug management.   This patient presents to the ED for concern of chest pain, this involves an extensive number of treatment options, and is a complaint that carries with it a high risk of complications and morbidity.  The differential diagnosis includes acute coronary syndrome, congestive heart failure, pericarditis, pneumonia, pulmonary embolism, tension pneumothorax, esophageal rupture, aortic dissection, cardiac tamponade, musculoskeletal   Co morbidities that complicate the patient evaluation  HTN, asthma   Additional history obtained:  Dr. Peru PCP   Problem List / ED Course / Critical interventions / Medication management  Patient presented for chest/back pain.  Pain started after lifting a heavy box of physical therapy.  No other infectious complaints today.  Patient afebrile with stable vitals.  Physical exam unremarkable.  GI cocktail helped relieve acid reflux symptoms. I Ordered, and personally interpreted labs.  The pertinent results include: Initial and repeat troponin within normal limits.  BMP reassuring.  CBC without leukocytosis or anemia. The patient was maintained on a cardiac monitor.  I personally viewed and interpreted the EKG/cardiac monitored which showed an underlying rhythm of: Sinus rhythm. Given that patient's chest pain is currently radiating to the back, I chose to obtain a dissection study.  I ordered imaging studies including chest xray/CTA chest/abd/pelv to assess for process contributing to patient's symptoms. I independently visualized and interpreted imaging which showed no acute process. I agree with the radiologist interpretation. CT also showing enlarged  thyroid - shared results with patient and educated on proper follow up for incidental findings.  Shared lab/imaging results with patient.  Overall, it appears that patient's symptoms are due to muscle strain given the symptoms happened while at PT earlier today.  Answered all patient's questions.  Recommended following up with PCP.  Patient verbalized understanding of plan. I have reviewed the patients home medicines and have made adjustments as needed Patient afebrile with stable vitals.  Provided with return precautions.  Discharged in good condition.  Ddx:  These are considered less likely due to history of present illness and physical exam findings.  -Acute coronary syndrome: EKG and troponins within normal limits  -Congestive heart failure: patient denies orthopnea, cough, and leg edema -Pneumonia: lungs are clear to auscultation bilaterally -Pulmonary embolism: no recent surgeries, blood clot hx, hemoptysis, cancer hx, vitals stable -Pneumothorax: lungs are clear to auscultation bilaterally -Esophageal rupture: patient denies vomiting, heavy drinking, and hx of GERD -Aortic dissection: vital signs are stable; CT reassuring -Cardiac tamponade: absence of hypotension, JVD, and muffled heart sounds   Social Determinants of Health:  none         Final Clinical Impression(s) / ED Diagnoses Final diagnoses:  Nonspecific chest pain    Rx / DC Orders ED Discharge Orders     None         Margarita Rana 01/03/24 2001    Arby Barrette, MD 01/03/24 2018

## 2024-01-03 NOTE — Discharge Instructions (Addendum)
It was a pleasure caring for you today. Please follow up with your primary care provider. Seek emergency care if experiencing any new or worsening symptoms.  CT findings for follow up with your PCP:  Incidental heterogeneous and enlarged thyroid. Recommend non-emergent outpatient thyroid ultrasound if not previously evaluated.  Alternating between 650 mg Tylenol and 400 mg Advil: The best way to alternate taking Acetaminophen (example Tylenol) and Ibuprofen (example Advil/Motrin) is to take them 3 hours apart. For example, if you take ibuprofen at 6 am you can then take Tylenol at 9 am. You can continue this regimen throughout the day, making sure you do not exceed the recommended maximum dose for each drug.

## 2024-01-03 NOTE — ED Triage Notes (Signed)
PT was at physical therapy for lower back pain and felt a sharp pain from mid back through to chest. [facility] went to [facility] and was advised to come to ED. Some SOB. Pt complains of increased belching.

## 2024-01-03 NOTE — ED Provider Triage Note (Signed)
Emergency Medicine Provider Triage Evaluation Note  Earl Ortiz , a 38 y.o. male  was evaluated in triage.  Pt complains of back to chest pain.  Review of Systems  Positive: As above Negative: SOB, pleuritic pain, abd pain  Physical Exam  BP (!) 136/98 (BP Location: Right Arm)   Pulse (!) 107   Temp 97.9 F (36.6 C)   Resp 18   Ht 6\' 4"  (1.93 m)   Wt 136.1 kg   SpO2 100%   BMI 36.52 kg/m  Gen:   Awake, no distress   Resp:  Normal effort  MSK:   Moves extremities without difficulty  Other:  RRR  Medical Decision Making  Medically screening exam initiated at 2:20 PM.  Appropriate orders placed.  Earl Ortiz was informed that the remainder of the evaluation will be completed by another provider, this initial triage assessment does not replace that evaluation, and the importance of remaining in the ED until their evaluation is complete.  Sudden onset sharp pain in midline thoracic back while lifting heavy object earlier today, extended through to chest. Has remained constant since. Was at PT at the time --> sent to [facility] --> sent to ED for further evaluation.    Earl Anis, PA-C 01/03/24 1421

## 2024-01-05 ENCOUNTER — Other Ambulatory Visit (HOSPITAL_COMMUNITY): Payer: Self-pay

## 2024-01-10 ENCOUNTER — Other Ambulatory Visit (HOSPITAL_BASED_OUTPATIENT_CLINIC_OR_DEPARTMENT_OTHER): Payer: Self-pay

## 2024-01-10 MED ORDER — METHYLPREDNISOLONE 4 MG PO TBPK
ORAL_TABLET | ORAL | 0 refills | Status: DC
  Filled 2024-01-10: qty 42, 12d supply, fill #0

## 2024-01-12 ENCOUNTER — Other Ambulatory Visit (HOSPITAL_BASED_OUTPATIENT_CLINIC_OR_DEPARTMENT_OTHER): Payer: Self-pay

## 2024-01-12 MED ORDER — TIZANIDINE HCL 4 MG PO TABS
4.0000 mg | ORAL_TABLET | Freq: Four times a day (QID) | ORAL | 0 refills | Status: DC
  Filled 2024-01-12: qty 30, 8d supply, fill #0

## 2024-01-15 ENCOUNTER — Other Ambulatory Visit (HOSPITAL_BASED_OUTPATIENT_CLINIC_OR_DEPARTMENT_OTHER): Payer: Self-pay

## 2024-01-15 ENCOUNTER — Other Ambulatory Visit (HOSPITAL_COMMUNITY): Payer: Self-pay

## 2024-01-19 ENCOUNTER — Other Ambulatory Visit (HOSPITAL_BASED_OUTPATIENT_CLINIC_OR_DEPARTMENT_OTHER): Payer: Self-pay | Admitting: Family Medicine

## 2024-01-19 DIAGNOSIS — M1A9XX1 Chronic gout, unspecified, with tophus (tophi): Secondary | ICD-10-CM

## 2024-02-10 ENCOUNTER — Other Ambulatory Visit (HOSPITAL_BASED_OUTPATIENT_CLINIC_OR_DEPARTMENT_OTHER): Payer: Self-pay

## 2024-02-10 MED ORDER — OSELTAMIVIR PHOSPHATE 75 MG PO CAPS
75.0000 mg | ORAL_CAPSULE | Freq: Two times a day (BID) | ORAL | 0 refills | Status: DC
  Filled 2024-02-10: qty 10, 5d supply, fill #0

## 2024-02-12 ENCOUNTER — Encounter (HOSPITAL_BASED_OUTPATIENT_CLINIC_OR_DEPARTMENT_OTHER): Payer: Self-pay | Admitting: Family Medicine

## 2024-02-12 ENCOUNTER — Ambulatory Visit (HOSPITAL_BASED_OUTPATIENT_CLINIC_OR_DEPARTMENT_OTHER): Payer: Managed Care, Other (non HMO) | Admitting: Family Medicine

## 2024-02-12 VITALS — BP 129/85 | HR 91 | Ht 76.0 in | Wt 311.4 lb

## 2024-02-12 DIAGNOSIS — E669 Obesity, unspecified: Secondary | ICD-10-CM | POA: Diagnosis not present

## 2024-02-12 DIAGNOSIS — I10 Essential (primary) hypertension: Secondary | ICD-10-CM

## 2024-02-12 DIAGNOSIS — R400 Somnolence: Secondary | ICD-10-CM | POA: Insufficient documentation

## 2024-02-12 DIAGNOSIS — E049 Nontoxic goiter, unspecified: Secondary | ICD-10-CM | POA: Diagnosis not present

## 2024-02-12 DIAGNOSIS — R7989 Other specified abnormal findings of blood chemistry: Secondary | ICD-10-CM | POA: Insufficient documentation

## 2024-02-12 DIAGNOSIS — G4733 Obstructive sleep apnea (adult) (pediatric): Secondary | ICD-10-CM | POA: Insufficient documentation

## 2024-02-12 HISTORY — DX: Nontoxic goiter, unspecified: E04.9

## 2024-02-12 NOTE — Patient Instructions (Signed)
  Medication Instructions:  Your physician recommends that you continue on your current medications as directed. Please refer to the Current Medication list given to you today. --If you need a refill on any your medications before your next appointment, please call your pharmacy first. If no refills are authorized on file call the office.-- Lab Work: Your physician has recommended that you have lab work today: today If you have labs (blood work) drawn today and your tests are completely normal, you will receive your results via MyChart message OR a phone call from our staff.  Please ensure you check your voicemail in the event that you authorized detailed messages to be left on a delegated number. If you have any lab test that is abnormal or we need to change your treatment, we will call you to review the results.  Referrals/Procedures/Imaging: Ultrasound [facility]  Follow-Up: Your next appointment:   Your physician recommends that you schedule a follow-up appointment in: 2 months follow up with Dr. de Peru  You will receive a text message or e-mail with a link to a survey about your care and experience with [place] today! We would greatly appreciate your feedback!   Thanks for letting us be apart of your health journey!!  Primary Care and Sports Medicine   Dr. Ceasar Mons Peru   We encourage you to activate your patient portal called "MyChart".  Sign up information is provided on this After Visit Summary.  MyChart is used to connect with patients for Virtual Visits (Telemedicine).  Patients are able to view lab/test results, encounter notes, upcoming appointments, etc.  Non-urgent messages can be sent to your provider as well. To learn more about what you can do with MyChart, please visit --  [email or web address].

## 2024-02-12 NOTE — Assessment & Plan Note (Signed)
 Patient was seen in the ED 11 over 28-month ago.  At that time, he was having chest pain with radiation to the back.  He had CTA performed and incidentally was noted to have an enlarged thyroid.  Recommendation related to this enlarged thyroid was to have outpatient ultrasound done.  He notes that he has had some temperature intolerance, has had difficulty with losing weight despite lifestyle modifications.  He has not had any obstructive symptoms however. Today, we will proceed with labs as below and we will also arrange for ultrasound to be completed to further assess thyroid.  Further recommendations pending results of labs and imaging

## 2024-02-12 NOTE — Assessment & Plan Note (Signed)
 Blood pressure borderline in office today.  Improved compared to prior office visits.  He continues with amlodipine as well as losartan.  Denies any issues with medications. Can continue with current medication regimen.  Recommend intermittent monitoring of blood pressure at home, DASH diet

## 2024-02-12 NOTE — Progress Notes (Signed)
    Procedures performed today:    None.  Independent interpretation of notes and tests performed by another provider:   None.  Brief History, Exam, Impression, and Recommendations:    BP 129/85 (BP Location: Right Arm, Patient Position: Sitting, Cuff Size: Large)   Pulse 91   Ht 6\' 4"  (1.93 m)   Wt (!) 311 lb 6.4 oz (141.3 kg)   SpO2 98%   BMI 37.90 kg/m   Enlarged thyroid Assessment & Plan: Patient was seen in the ED 11 over 82-month ago.  At that time, he was having chest pain with radiation to the back.  He had CTA performed and incidentally was noted to have an enlarged thyroid.  Recommendation related to this enlarged thyroid was to have outpatient ultrasound done.  He notes that he has had some temperature intolerance, has had difficulty with losing weight despite lifestyle modifications.  He has not had any obstructive symptoms however. Today, we will proceed with labs as below and we will also arrange for ultrasound to be completed to further assess thyroid.  Further recommendations pending results of labs and imaging  Orders: -     US THYROID; Future -     TSH + free T4 -     T3 -     Thyroid peroxidase antibody  Primary hypertension Assessment & Plan: Blood pressure borderline in office today.  Improved compared to prior office visits.  He continues with amlodipine as well as losartan.  Denies any issues with medications. Can continue with current medication regimen.  Recommend intermittent monitoring of blood pressure at home, DASH diet   Obesity (BMI 30-39.9) Assessment & Plan: Previously, we were trying to obtain Zepbound for patient to initiate to assist with lifestyle modifications related to weight loss goals.  No approval was noted related to Zepbound in attempting to obtain prior authorization.  We discussed options, he would be open to possible trial of Wegovy.  Would like to have thyroid evaluation completed prior.  Ultimately, if not able to start any  medication, then he may be open to working with healthy weight loss clinic.   Return in about 2 months (around 04/13/2024).   ___________________________________________ Sejla Marzano de [place], MD, ABFM, CAQSM Primary Care and Sports Medicine [facility]

## 2024-02-12 NOTE — Assessment & Plan Note (Signed)
 Previously, we were trying to obtain Zepbound for patient to initiate to assist with lifestyle modifications related to weight loss goals.  No approval was noted related to Zepbound in attempting to obtain prior authorization.  We discussed options, he would be open to possible trial of Wegovy.  Would like to have thyroid evaluation completed prior.  Ultimately, if not able to start any medication, then he may be open to working with healthy weight loss clinic.

## 2024-02-13 LAB — T3: T3, Total: 121 ng/dL (ref 71–180)

## 2024-02-13 LAB — THYROID PEROXIDASE ANTIBODY: Thyroperoxidase Ab SerPl-aCnc: <9 [IU]/mL (ref 0–34)

## 2024-02-13 LAB — TSH+FREE T4
Free T4: 1.22 ng/dL (ref 0.82–1.77)
TSH: 0.317 u[IU]/mL — ABNORMAL LOW (ref 0.450–4.500)

## 2024-02-15 ENCOUNTER — Other Ambulatory Visit (HOSPITAL_BASED_OUTPATIENT_CLINIC_OR_DEPARTMENT_OTHER): Payer: Self-pay | Admitting: *Deleted

## 2024-02-15 ENCOUNTER — Other Ambulatory Visit (HOSPITAL_BASED_OUTPATIENT_CLINIC_OR_DEPARTMENT_OTHER): Payer: Self-pay | Admitting: Family Medicine

## 2024-02-15 DIAGNOSIS — E049 Nontoxic goiter, unspecified: Secondary | ICD-10-CM

## 2024-02-15 NOTE — Progress Notes (Signed)
Error

## 2024-02-16 LAB — SPECIMEN STATUS REPORT

## 2024-02-16 LAB — THYROID STIMULATING IMMUNOGLOBULIN: Thyroid Stim Immunoglobulin: <0.1 IU/L (ref 0.00–0.55)

## 2024-02-21 ENCOUNTER — Encounter (HOSPITAL_BASED_OUTPATIENT_CLINIC_OR_DEPARTMENT_OTHER): Payer: Self-pay | Admitting: *Deleted

## 2024-03-07 ENCOUNTER — Other Ambulatory Visit (HOSPITAL_BASED_OUTPATIENT_CLINIC_OR_DEPARTMENT_OTHER): Payer: Self-pay

## 2024-03-15 ENCOUNTER — Other Ambulatory Visit (HOSPITAL_BASED_OUTPATIENT_CLINIC_OR_DEPARTMENT_OTHER): Payer: Self-pay | Admitting: Family Medicine

## 2024-04-11 ENCOUNTER — Other Ambulatory Visit

## 2024-04-12 ENCOUNTER — Ambulatory Visit
Admission: RE | Admit: 2024-04-12 | Discharge: 2024-04-12 | Disposition: A | Discharge status: Home / Self Care | Source: Ambulatory Visit | Attending: Family Medicine | Admitting: Family Medicine

## 2024-04-12 DIAGNOSIS — E049 Nontoxic goiter, unspecified: Secondary | ICD-10-CM

## 2024-04-17 ENCOUNTER — Other Ambulatory Visit (HOSPITAL_BASED_OUTPATIENT_CLINIC_OR_DEPARTMENT_OTHER): Payer: Self-pay | Admitting: Family Medicine

## 2024-04-17 ENCOUNTER — Ambulatory Visit (HOSPITAL_BASED_OUTPATIENT_CLINIC_OR_DEPARTMENT_OTHER): Admitting: Family Medicine

## 2024-04-17 VITALS — BP 121/88 | HR 89 | Temp 98.5°F | Resp 16 | Ht 76.0 in | Wt 315.0 lb

## 2024-04-17 DIAGNOSIS — E042 Nontoxic multinodular goiter: Secondary | ICD-10-CM

## 2024-04-17 DIAGNOSIS — I10 Essential (primary) hypertension: Secondary | ICD-10-CM

## 2024-04-17 DIAGNOSIS — G4733 Obstructive sleep apnea (adult) (pediatric): Secondary | ICD-10-CM | POA: Insufficient documentation

## 2024-04-17 DIAGNOSIS — E669 Obesity, unspecified: Secondary | ICD-10-CM | POA: Diagnosis not present

## 2024-04-17 NOTE — Assessment & Plan Note (Signed)
 Recent thyroid  ultrasound with evidence of 2 thyroid  nodules.  Based upon appearance, recommendation is to proceed with FNA of these nodules for further assessment.  Thyroid  labs did show slightly low TSH, but with normal free T4 and T3. Reviewed lab and imaging results and discussed recommendation to have FNA completed.  Patient in agreement, order placed

## 2024-04-17 NOTE — Assessment & Plan Note (Signed)
 We previously did look to start Zepbound , however patient has not heard any further information regarding this.  He continues with lifestyle modifications, however without significant change noted in regards to weight.  He does feel as though he has gotten stronger with some of the adjustments that he has made.  Still does occasionally have MSK issues, primarily related to back.  Currently is seeing provider through [facility]. We will look further into Zepbound  and potential authorization through insurance.

## 2024-04-17 NOTE — Progress Notes (Signed)
    Procedures performed today:    None.  Independent interpretation of notes and tests performed by another provider:   None.  Brief History, Exam, Impression, and Recommendations:    BP 121/88   Pulse 89   Temp 98.5 F (36.9 C) (Oral)   Resp 16   Ht 6\' 4"  (1.93 m)   Wt (!) 315 lb (142.9 kg)   SpO2 96%   BMI 38.34 kg/m   Multiple thyroid  nodules Assessment & Plan: Recent thyroid  ultrasound with evidence of 2 thyroid  nodules.  Based upon appearance, recommendation is to proceed with FNA of these nodules for further assessment.  Thyroid  labs did show slightly low TSH, but with normal free T4 and T3. Reviewed lab and imaging results and discussed recommendation to have FNA completed.  Patient in agreement, order placed  Orders: -     US  FNA BX THYROID  1ST LESION AFIRMA; Future  Primary hypertension Assessment & Plan: Blood pressure borderline in office today.  Improved compared to prior office visits.  He continues with amlodipine  as well as valsartan .  Denies any issues with medications. Can continue with current medication regimen.  Recommend intermittent monitoring of blood pressure at home, DASH diet   Obesity (BMI 30-39.9) Assessment & Plan: We previously did look to start Zepbound , however patient has not heard any further information regarding this.  He continues with lifestyle modifications, however without significant change noted in regards to weight.  He does feel as though he has gotten stronger with some of the adjustments that he has made.  Still does occasionally have MSK issues, primarily related to back.  Currently is seeing provider through [facility]. We will look further into Zepbound  and potential authorization through insurance.   Return if symptoms worsen or fail to improve.   ___________________________________________ Mehreen Azizi de [place], MD, ABFM, [facility] Primary Care and Sports Medicine [facility]

## 2024-04-17 NOTE — Assessment & Plan Note (Signed)
 Blood pressure borderline in office today.  Improved compared to prior office visits.  He continues with amlodipine  as well as valsartan .  Denies any issues with medications. Can continue with current medication regimen.  Recommend intermittent monitoring of blood pressure at home, DASH diet

## 2024-05-01 ENCOUNTER — Other Ambulatory Visit (HOSPITAL_BASED_OUTPATIENT_CLINIC_OR_DEPARTMENT_OTHER): Payer: Self-pay

## 2024-05-07 ENCOUNTER — Other Ambulatory Visit (HOSPITAL_BASED_OUTPATIENT_CLINIC_OR_DEPARTMENT_OTHER): Payer: Self-pay

## 2024-05-07 MED ORDER — GABAPENTIN 300 MG PO CAPS
300.0000 mg | ORAL_CAPSULE | Freq: Three times a day (TID) | ORAL | 1 refills | Status: DC
Start: 2024-05-07 — End: 2024-07-16
  Filled 2024-05-07: qty 90, 30d supply, fill #0
  Filled 2024-06-02: qty 90, 30d supply, fill #1

## 2024-05-07 MED ORDER — CELECOXIB 200 MG PO CAPS
200.0000 mg | ORAL_CAPSULE | Freq: Two times a day (BID) | ORAL | 1 refills | Status: DC
  Filled 2024-05-07: qty 60, 30d supply, fill #0
  Filled 2024-06-02: qty 60, 30d supply, fill #1

## 2024-05-08 ENCOUNTER — Other Ambulatory Visit (HOSPITAL_BASED_OUTPATIENT_CLINIC_OR_DEPARTMENT_OTHER): Payer: Self-pay

## 2024-05-08 MED ORDER — GABAPENTIN 300 MG PO CAPS
300.0000 mg | ORAL_CAPSULE | Freq: Two times a day (BID) | ORAL | 0 refills | Status: DC
  Filled 2024-05-08: qty 60, 30d supply, fill #0

## 2024-05-27 ENCOUNTER — Ambulatory Visit
Admission: RE | Admit: 2024-05-27 | Discharge: 2024-05-27 | Disposition: A | Discharge status: Home / Self Care | Source: Ambulatory Visit | Attending: Family Medicine

## 2024-05-27 ENCOUNTER — Other Ambulatory Visit (HOSPITAL_COMMUNITY)
Admission: RE | Admit: 2024-05-27 | Discharge: 2024-05-27 | Disposition: A | Discharge status: Home / Self Care | Source: Ambulatory Visit | Attending: Family Medicine | Admitting: Family Medicine

## 2024-05-27 DIAGNOSIS — E042 Nontoxic multinodular goiter: Secondary | ICD-10-CM

## 2024-05-29 ENCOUNTER — Encounter (HOSPITAL_BASED_OUTPATIENT_CLINIC_OR_DEPARTMENT_OTHER): Payer: Self-pay | Admitting: Family Medicine

## 2024-05-29 DIAGNOSIS — E669 Obesity, unspecified: Secondary | ICD-10-CM

## 2024-05-29 DIAGNOSIS — I10 Essential (primary) hypertension: Secondary | ICD-10-CM

## 2024-05-29 LAB — CYTOLOGY - NON PAP

## 2024-05-30 ENCOUNTER — Other Ambulatory Visit (HOSPITAL_BASED_OUTPATIENT_CLINIC_OR_DEPARTMENT_OTHER): Payer: Self-pay

## 2024-05-30 ENCOUNTER — Other Ambulatory Visit: Payer: Self-pay

## 2024-05-30 MED ORDER — METHYLPREDNISOLONE 4 MG PO TABS
ORAL_TABLET | ORAL | 0 refills | Status: DC
  Filled 2024-05-30: qty 42, 12d supply, fill #0

## 2024-05-30 MED ORDER — SEMAGLUTIDE-WEIGHT MANAGEMENT 0.25 MG/0.5ML ~~LOC~~ SOAJ
0.2500 mg | SUBCUTANEOUS | 1 refills | Status: DC
  Filled 2024-05-30 – 2024-06-17 (×2): qty 2, 28d supply, fill #0

## 2024-05-31 ENCOUNTER — Ambulatory Visit (HOSPITAL_BASED_OUTPATIENT_CLINIC_OR_DEPARTMENT_OTHER): Payer: Self-pay | Admitting: Family Medicine

## 2024-06-02 ENCOUNTER — Other Ambulatory Visit (HOSPITAL_BASED_OUTPATIENT_CLINIC_OR_DEPARTMENT_OTHER): Payer: Self-pay | Admitting: Family Medicine

## 2024-06-03 ENCOUNTER — Other Ambulatory Visit: Payer: Self-pay

## 2024-06-04 ENCOUNTER — Other Ambulatory Visit (HOSPITAL_BASED_OUTPATIENT_CLINIC_OR_DEPARTMENT_OTHER): Payer: Self-pay

## 2024-06-04 MED ORDER — COLCHICINE 0.6 MG PO TABS
0.6000 mg | ORAL_TABLET | Freq: Every day | ORAL | 0 refills | Status: AC
  Filled 2024-06-04: qty 30, 30d supply, fill #0

## 2024-06-05 ENCOUNTER — Other Ambulatory Visit (HOSPITAL_BASED_OUTPATIENT_CLINIC_OR_DEPARTMENT_OTHER): Payer: Self-pay

## 2024-06-17 ENCOUNTER — Other Ambulatory Visit (HOSPITAL_BASED_OUTPATIENT_CLINIC_OR_DEPARTMENT_OTHER): Payer: Self-pay

## 2024-06-18 ENCOUNTER — Other Ambulatory Visit (HOSPITAL_BASED_OUTPATIENT_CLINIC_OR_DEPARTMENT_OTHER): Payer: Self-pay

## 2024-06-20 ENCOUNTER — Other Ambulatory Visit: Payer: Self-pay

## 2024-06-20 ENCOUNTER — Other Ambulatory Visit (HOSPITAL_BASED_OUTPATIENT_CLINIC_OR_DEPARTMENT_OTHER): Payer: Self-pay

## 2024-06-20 MED ORDER — CELECOXIB 200 MG PO CAPS
200.0000 mg | ORAL_CAPSULE | Freq: Two times a day (BID) | ORAL | 2 refills | Status: AC
  Filled 2024-06-20: qty 60, 30d supply, fill #0
  Filled 2024-09-08 – 2024-10-06 (×2): qty 60, 30d supply, fill #1
  Filled 2024-11-27: qty 60, 30d supply, fill #2

## 2024-06-20 MED ORDER — GABAPENTIN 300 MG PO CAPS
300.0000 mg | ORAL_CAPSULE | Freq: Three times a day (TID) | ORAL | 2 refills | Status: DC
  Filled 2024-07-21: qty 90, 30d supply, fill #0
  Filled 2024-08-26: qty 90, 30d supply, fill #1
  Filled 2024-09-28: qty 90, 30d supply, fill #2

## 2024-07-16 ENCOUNTER — Encounter (HOSPITAL_BASED_OUTPATIENT_CLINIC_OR_DEPARTMENT_OTHER): Payer: Self-pay | Admitting: Family Medicine

## 2024-07-16 ENCOUNTER — Ambulatory Visit (HOSPITAL_BASED_OUTPATIENT_CLINIC_OR_DEPARTMENT_OTHER): Admitting: Family Medicine

## 2024-07-16 ENCOUNTER — Other Ambulatory Visit (HOSPITAL_BASED_OUTPATIENT_CLINIC_OR_DEPARTMENT_OTHER): Payer: Self-pay

## 2024-07-16 VITALS — BP 125/85 | HR 74 | Ht 76.0 in | Wt 319.3 lb

## 2024-07-16 DIAGNOSIS — E669 Obesity, unspecified: Secondary | ICD-10-CM | POA: Diagnosis not present

## 2024-07-16 DIAGNOSIS — E042 Nontoxic multinodular goiter: Secondary | ICD-10-CM | POA: Diagnosis not present

## 2024-07-16 MED ORDER — SEMAGLUTIDE-WEIGHT MANAGEMENT 0.5 MG/0.5ML ~~LOC~~ SOAJ
0.5000 mg | SUBCUTANEOUS | 1 refills | Status: DC
  Filled 2024-07-16: qty 2, 28d supply, fill #0

## 2024-07-16 NOTE — Patient Instructions (Signed)
  Medication Instructions:  Your physician recommends that you continue on your current medications as directed. Please refer to the Current Medication list given to you today. --If you need a refill on any your medications before your next appointment, please call your pharmacy first. If no refills are authorized on file call the office.--   Follow-Up: Your next appointment:   Your physician recommends that you schedule a follow-up appointment in: 2 month follow up  with Dr. de [place]  You will receive a text message or e-mail with a link to a survey about your care and experience with [place] today! We would greatly appreciate your feedback!   Thanks for letting us be apart of your health journey!!  Primary Care and Sports Medicine   Dr. Ceasar Mons [place]   We encourage you to activate your patient portal called "MyChart".  Sign up information is provided on this After Visit Summary.  MyChart is used to connect with patients for Virtual Visits (Telemedicine).  Patients are able to view lab/test results, encounter notes, upcoming appointments, etc.  Non-urgent messages can be sent to your provider as well. To learn more about what you can do with MyChart, please visit --  [email or web address].

## 2024-07-16 NOTE — Progress Notes (Signed)
    Procedures performed today:    None.  Independent interpretation of notes and tests performed by another provider:   None.  Brief History, Exam, Impression, and Recommendations:    BP 125/85 (BP Location: Right Arm, Patient Position: Sitting, Cuff Size: Large)   Pulse 74   Ht 6' 4 (1.93 m)   Wt (!) 319 lb 4.8 oz (144.8 kg)   SpO2 97%   BMI 38.87 kg/m   Multiple thyroid  nodules Assessment & Plan: Results of biopsy were Bethesda category 2 which is reassuring.  Given this, recommendation is to have repeat ultrasound completed in 1 to 2 years for monitoring.  Did discuss that all this biopsy result indicates very low likelihood of underlying malignant concern for nodules, risk is not 0 which is reason for having repeat ultrasound completed in the future  Orders: -     Semaglutide -Weight Management; Inject 0.5 mg into the skin once a week.  Dispense: 2 mL; Refill: 1  Obesity (BMI 30-39.9) Assessment & Plan: Patient reports that he has been doing well with Wegovy , has been administering 0.25 mg dose.  Has had some mild nausea, but otherwise doing well.  He feels that he has had decreased appetite/early satiety.  He feels that while he has not had significant weight change as of yet, he has noted change in body composition.  He continues to focus on lifestyle modifications in conjunction with medication. Starting weight: 215 pounds Current weight: 219 pounds At this time, can continue with medication, we discussed titration and patient would be open to this, will increase to 0.5 mg dose.  Cautioned on potential side effects.  If doing well at home with this new dose after 4 weeks, advised that he can let us  know and we can always increase to next dose  Orders: -     Semaglutide -Weight Management; Inject 0.5 mg into the skin once a week.  Dispense: 2 mL; Refill: 1  Return in about 2 months (around 09/15/2024) for hypertension, med  check.   ___________________________________________ Kaleb Sek de [place], MD, ABFM, CAQSM Primary Care and Sports Medicine [facility]

## 2024-07-16 NOTE — Assessment & Plan Note (Signed)
 Results of biopsy were Bethesda category 2 which is reassuring.  Given this, recommendation is to have repeat ultrasound completed in 1 to 2 years for monitoring.  Did discuss that all this biopsy result indicates very low likelihood of underlying malignant concern for nodules, risk is not 0 which is reason for having repeat ultrasound completed in the future

## 2024-07-16 NOTE — Assessment & Plan Note (Signed)
 Patient reports that he has been doing well with Wegovy , has been administering 0.25 mg dose.  Has had some mild nausea, but otherwise doing well.  He feels that he has had decreased appetite/early satiety.  He feels that while he has not had significant weight change as of yet, he has noted change in body composition.  He continues to focus on lifestyle modifications in conjunction with medication. Starting weight: 215 pounds Current weight: 219 pounds At this time, can continue with medication, we discussed titration and patient would be open to this, will increase to 0.5 mg dose.  Cautioned on potential side effects.  If doing well at home with this new dose after 4 weeks, advised that he can let us  know and we can always increase to next dose

## 2024-07-17 ENCOUNTER — Other Ambulatory Visit (HOSPITAL_BASED_OUTPATIENT_CLINIC_OR_DEPARTMENT_OTHER): Payer: Self-pay

## 2024-07-18 ENCOUNTER — Other Ambulatory Visit (HOSPITAL_BASED_OUTPATIENT_CLINIC_OR_DEPARTMENT_OTHER): Payer: Self-pay

## 2024-07-18 MED ORDER — CELECOXIB 200 MG PO CAPS
200.0000 mg | ORAL_CAPSULE | Freq: Two times a day (BID) | ORAL | 0 refills | Status: DC
  Filled 2024-08-26: qty 90, 45d supply, fill #0

## 2024-07-18 MED ORDER — ZOLPIDEM TARTRATE ER 12.5 MG PO TBCR: 12.5000 mg | EXTENDED_RELEASE_TABLET | Freq: Every evening | ORAL | 3 refills | Status: AC | PRN

## 2024-07-21 ENCOUNTER — Other Ambulatory Visit (HOSPITAL_BASED_OUTPATIENT_CLINIC_OR_DEPARTMENT_OTHER): Payer: Self-pay

## 2024-07-22 ENCOUNTER — Other Ambulatory Visit: Payer: Self-pay

## 2024-07-23 ENCOUNTER — Other Ambulatory Visit (HOSPITAL_BASED_OUTPATIENT_CLINIC_OR_DEPARTMENT_OTHER): Payer: Self-pay | Admitting: Family Medicine

## 2024-07-23 DIAGNOSIS — M1A9XX1 Chronic gout, unspecified, with tophus (tophi): Secondary | ICD-10-CM

## 2024-07-25 ENCOUNTER — Other Ambulatory Visit (HOSPITAL_BASED_OUTPATIENT_CLINIC_OR_DEPARTMENT_OTHER): Payer: Self-pay

## 2024-08-07 ENCOUNTER — Other Ambulatory Visit (HOSPITAL_BASED_OUTPATIENT_CLINIC_OR_DEPARTMENT_OTHER): Payer: Self-pay

## 2024-08-07 ENCOUNTER — Encounter (HOSPITAL_BASED_OUTPATIENT_CLINIC_OR_DEPARTMENT_OTHER): Payer: Self-pay | Admitting: Family Medicine

## 2024-08-07 DIAGNOSIS — I10 Essential (primary) hypertension: Secondary | ICD-10-CM

## 2024-08-07 DIAGNOSIS — E669 Obesity, unspecified: Secondary | ICD-10-CM

## 2024-08-07 MED ORDER — SEMAGLUTIDE-WEIGHT MANAGEMENT 1 MG/0.5ML ~~LOC~~ SOAJ
1.0000 mg | SUBCUTANEOUS | 1 refills | Status: DC
  Filled 2024-08-07: qty 2, 28d supply, fill #0
  Filled 2024-09-08: qty 2, 28d supply, fill #1

## 2024-08-26 ENCOUNTER — Other Ambulatory Visit (HOSPITAL_BASED_OUTPATIENT_CLINIC_OR_DEPARTMENT_OTHER): Payer: Self-pay

## 2024-08-26 ENCOUNTER — Other Ambulatory Visit: Payer: Self-pay

## 2024-08-26 MED FILL — Valsartan Tab 80 MG: ORAL | 90 days supply | Qty: 90 | Fill #0 | Status: AC

## 2024-09-05 ENCOUNTER — Encounter (HOSPITAL_BASED_OUTPATIENT_CLINIC_OR_DEPARTMENT_OTHER): Payer: Self-pay | Admitting: Family Medicine

## 2024-09-09 ENCOUNTER — Other Ambulatory Visit: Payer: Self-pay

## 2024-09-09 ENCOUNTER — Other Ambulatory Visit (HOSPITAL_BASED_OUTPATIENT_CLINIC_OR_DEPARTMENT_OTHER): Payer: Self-pay

## 2024-09-18 ENCOUNTER — Ambulatory Visit (HOSPITAL_BASED_OUTPATIENT_CLINIC_OR_DEPARTMENT_OTHER): Admitting: Family Medicine

## 2024-09-19 ENCOUNTER — Other Ambulatory Visit (HOSPITAL_BASED_OUTPATIENT_CLINIC_OR_DEPARTMENT_OTHER): Payer: Self-pay

## 2024-09-19 MED ORDER — CELECOXIB 200 MG PO CAPS
200.0000 mg | ORAL_CAPSULE | Freq: Two times a day (BID) | ORAL | 5 refills | Status: AC
  Filled 2024-09-19 – 2024-12-22 (×2): qty 60, 30d supply, fill #0

## 2024-09-19 MED ORDER — GABAPENTIN 300 MG PO CAPS
300.0000 mg | ORAL_CAPSULE | Freq: Three times a day (TID) | ORAL | 5 refills | Status: AC
  Filled 2024-09-19 – 2024-10-29 (×2): qty 90, 30d supply, fill #0
  Filled 2024-11-27: qty 90, 30d supply, fill #1
  Filled 2024-12-31: qty 90, 30d supply, fill #2

## 2024-09-25 ENCOUNTER — Ambulatory Visit (HOSPITAL_BASED_OUTPATIENT_CLINIC_OR_DEPARTMENT_OTHER): Admitting: Family Medicine

## 2024-09-26 ENCOUNTER — Other Ambulatory Visit (HOSPITAL_BASED_OUTPATIENT_CLINIC_OR_DEPARTMENT_OTHER): Payer: Self-pay | Admitting: Family Medicine

## 2024-10-01 ENCOUNTER — Encounter (HOSPITAL_BASED_OUTPATIENT_CLINIC_OR_DEPARTMENT_OTHER): Payer: Self-pay | Admitting: Family Medicine

## 2024-10-01 ENCOUNTER — Other Ambulatory Visit (HOSPITAL_BASED_OUTPATIENT_CLINIC_OR_DEPARTMENT_OTHER): Payer: Self-pay

## 2024-10-01 MED ORDER — AMLODIPINE BESYLATE 5 MG PO TABS
5.0000 mg | ORAL_TABLET | Freq: Every day | ORAL | 1 refills | Status: AC
  Filled 2024-10-01: qty 90, 90d supply, fill #0
  Filled 2024-11-27 – 2024-12-31 (×2): qty 90, 90d supply, fill #1

## 2024-10-14 ENCOUNTER — Encounter (HOSPITAL_BASED_OUTPATIENT_CLINIC_OR_DEPARTMENT_OTHER): Payer: Self-pay | Admitting: Family Medicine

## 2024-10-14 ENCOUNTER — Other Ambulatory Visit (HOSPITAL_BASED_OUTPATIENT_CLINIC_OR_DEPARTMENT_OTHER): Payer: Self-pay

## 2024-10-14 ENCOUNTER — Ambulatory Visit (HOSPITAL_BASED_OUTPATIENT_CLINIC_OR_DEPARTMENT_OTHER): Admitting: Family Medicine

## 2024-10-14 VITALS — BP 136/89 | HR 84 | Temp 98.2°F | Resp 18 | Ht 76.0 in | Wt 312.0 lb

## 2024-10-14 DIAGNOSIS — I10 Essential (primary) hypertension: Secondary | ICD-10-CM

## 2024-10-14 DIAGNOSIS — E669 Obesity, unspecified: Secondary | ICD-10-CM

## 2024-10-14 MED ORDER — SEMAGLUTIDE-WEIGHT MANAGEMENT 1 MG/0.5ML ~~LOC~~ SOAJ
1.0000 mg | SUBCUTANEOUS | 1 refills | Status: DC
  Filled 2024-10-14: qty 2, 28d supply, fill #0

## 2024-10-14 NOTE — Assessment & Plan Note (Addendum)
 Patient reports that he has been doing well with Wegovy , has been administering 1 mg dose - did miss dose last week.  Has had some mild nausea, but otherwise doing well.  He feels that he has had decreased appetite/early satiety.  He feels that while he has not had significant weight change as of yet, he has noted change in body composition.  He continues to focus on lifestyle modifications in conjunction with medication. Starting weight: 315 pounds Current weight: 312 pounds We discussed considerations including continuing with 1 mg dose or increasing.  Did discuss risk with increasing dose given recent missed dose.  After discussion, patient elected to resume 1 mg dose for a month.  If he is tolerating this well, he will send us  a message and we can send in prescription for 1.7 mg dose of Wegovy . Cautioned on potential side effects. Will plan to follow-up in about 2 months to assess progress with medication

## 2024-10-14 NOTE — Progress Notes (Signed)
    Procedures performed today:    None.  Independent interpretation of notes and tests performed by another provider:   None.  Brief History, Exam, Impression, and Recommendations:    BP 136/89 (BP Location: Left Arm, Patient Position: Sitting, Cuff Size: Large)   Pulse 84   Temp 98.2 F (36.8 C) (Oral)   Resp 18   Ht 6' 4 (1.93 m)   Wt (!) 312 lb (141.5 kg)   SpO2 96%   BMI 37.98 kg/m   Obesity (BMI 30-39.9) Assessment & Plan: Patient reports that he has been doing well with Wegovy , has been administering 1 mg dose - did miss dose last week.  Has had some mild nausea, but otherwise doing well.  He feels that he has had decreased appetite/early satiety.  He feels that while he has not had significant weight change as of yet, he has noted change in body composition.  He continues to focus on lifestyle modifications in conjunction with medication. Starting weight: 315 pounds Current weight: 312 pounds We discussed considerations including continuing with 1 mg dose or increasing.  Did discuss risk with increasing dose given recent missed dose.  After discussion, patient elected to resume 1 mg dose for a month.  If he is tolerating this well, he will send us  a message and we can send in prescription for 1.7 mg dose of Wegovy . Cautioned on potential side effects. Will plan to follow-up in about 2 months to assess progress with medication  Orders: -     Semaglutide -Weight Management; Inject 1 mg into the skin once a week.  Dispense: 2 mL; Refill: 1  Primary hypertension Assessment & Plan: Blood pressure borderline in office today.  He continues with amlodipine  as well as valsartan .  Denies any issues with medications. Can continue with current medication regimen.  Recommend intermittent monitoring of blood pressure at home, DASH diet  Orders: -     Semaglutide -Weight Management; Inject 1 mg into the skin once a week.  Dispense: 2 mL; Refill: 1  Return in about 2 months (around  12/14/2024) for med check, hypertension.   ___________________________________________ Tamryn Popko de [place], MD, ABFM, CAQSM Primary Care and Sports Medicine [facility]

## 2024-10-14 NOTE — Assessment & Plan Note (Signed)
 Blood pressure borderline in office today.  He continues with amlodipine  as well as valsartan .  Denies any issues with medications. Can continue with current medication regimen.  Recommend intermittent monitoring of blood pressure at home, DASH diet

## 2024-10-29 ENCOUNTER — Encounter (HOSPITAL_BASED_OUTPATIENT_CLINIC_OR_DEPARTMENT_OTHER): Payer: Self-pay | Admitting: Family Medicine

## 2024-10-29 DIAGNOSIS — E669 Obesity, unspecified: Secondary | ICD-10-CM

## 2024-10-29 DIAGNOSIS — M1A9XX1 Chronic gout, unspecified, with tophus (tophi): Secondary | ICD-10-CM

## 2024-10-29 DIAGNOSIS — I10 Essential (primary) hypertension: Secondary | ICD-10-CM

## 2024-10-30 ENCOUNTER — Other Ambulatory Visit (HOSPITAL_BASED_OUTPATIENT_CLINIC_OR_DEPARTMENT_OTHER): Payer: Self-pay

## 2024-10-30 ENCOUNTER — Other Ambulatory Visit: Payer: Self-pay

## 2024-10-30 MED ORDER — FEBUXOSTAT 40 MG PO TABS
40.0000 mg | ORAL_TABLET | Freq: Every day | ORAL | 1 refills | Status: AC
  Filled 2024-10-30: qty 90, 90d supply, fill #0
  Filled 2024-11-27: qty 90, 90d supply, fill #1

## 2024-11-02 ENCOUNTER — Other Ambulatory Visit (HOSPITAL_BASED_OUTPATIENT_CLINIC_OR_DEPARTMENT_OTHER): Payer: Self-pay

## 2024-11-10 NOTE — Telephone Encounter (Signed)
 Please see new mychart sent by pt about pt requesting to have Wegovy  dosage increased.

## 2024-11-12 ENCOUNTER — Other Ambulatory Visit (HOSPITAL_BASED_OUTPATIENT_CLINIC_OR_DEPARTMENT_OTHER): Payer: Self-pay

## 2024-11-12 MED ORDER — WEGOVY 1.7 MG/0.75ML ~~LOC~~ SOAJ
1.7000 mg | SUBCUTANEOUS | 1 refills | Status: AC
  Filled 2024-11-12: qty 3, 28d supply, fill #0
  Filled 2024-11-27 – 2024-12-06 (×3): qty 3, 28d supply, fill #1

## 2024-11-12 NOTE — Addendum Note (Signed)
 Addended by: DE CUBA, Arnav Cregg J on: 11/12/2024 09:16 AM   Modules accepted: Orders

## 2024-11-18 ENCOUNTER — Other Ambulatory Visit (HOSPITAL_BASED_OUTPATIENT_CLINIC_OR_DEPARTMENT_OTHER): Payer: Self-pay | Admitting: Family Medicine

## 2024-11-19 ENCOUNTER — Other Ambulatory Visit (HOSPITAL_BASED_OUTPATIENT_CLINIC_OR_DEPARTMENT_OTHER): Payer: Self-pay

## 2024-11-19 MED ORDER — VALSARTAN 80 MG PO TABS
80.0000 mg | ORAL_TABLET | Freq: Every day | ORAL | 1 refills | Status: AC
  Filled 2024-11-19: qty 90, 90d supply, fill #0
  Filled 2024-11-27: qty 90, 90d supply, fill #1

## 2024-11-27 ENCOUNTER — Other Ambulatory Visit: Payer: Self-pay

## 2024-11-27 ENCOUNTER — Other Ambulatory Visit (HOSPITAL_BASED_OUTPATIENT_CLINIC_OR_DEPARTMENT_OTHER): Payer: Self-pay

## 2024-12-23 ENCOUNTER — Other Ambulatory Visit: Payer: Self-pay

## 2024-12-23 ENCOUNTER — Other Ambulatory Visit (HOSPITAL_BASED_OUTPATIENT_CLINIC_OR_DEPARTMENT_OTHER): Payer: Self-pay

## 2024-12-31 ENCOUNTER — Other Ambulatory Visit (HOSPITAL_BASED_OUTPATIENT_CLINIC_OR_DEPARTMENT_OTHER): Payer: Self-pay

## 2025-01-01 ENCOUNTER — Other Ambulatory Visit (HOSPITAL_COMMUNITY): Payer: Self-pay

## 2025-01-01 ENCOUNTER — Telehealth (HOSPITAL_BASED_OUTPATIENT_CLINIC_OR_DEPARTMENT_OTHER): Payer: Self-pay | Admitting: Pharmacy Technician

## 2025-01-01 NOTE — Telephone Encounter (Signed)
 Pharmacy Patient Advocate Encounter   Received notification from [facility] KEY that prior authorization for Wegovy  is due for renewal.   Insurance verification completed.   The patient is insured through [employer].  Action: PA required; PA started via CoverMyMeds. KEY [id number] . Please see clinical question(s) below that I am not finding the answer to in their chart and advise.  Can patient be scheduled for an office visit to obtain an updated weight? At his last office visit in November he had only lost 3 pounds which would result in a denial. Please advise.

## 2025-01-09 NOTE — Telephone Encounter (Signed)
 Can we reach out to pt to get him scheduled for a follow up appt for weight management? For this appt, pt should be able to be scheduled with any of our three providers.

## 2025-01-12 ENCOUNTER — Encounter (HOSPITAL_BASED_OUTPATIENT_CLINIC_OR_DEPARTMENT_OTHER): Payer: Self-pay | Admitting: *Deleted

## 2025-01-14 ENCOUNTER — Ambulatory Visit (HOSPITAL_BASED_OUTPATIENT_CLINIC_OR_DEPARTMENT_OTHER): Admitting: Family Medicine

## 2025-01-14 ENCOUNTER — Encounter (HOSPITAL_BASED_OUTPATIENT_CLINIC_OR_DEPARTMENT_OTHER): Payer: Self-pay

## 2025-01-22 ENCOUNTER — Ambulatory Visit (HOSPITAL_BASED_OUTPATIENT_CLINIC_OR_DEPARTMENT_OTHER): Admitting: Family Medicine
